# Patient Record
Sex: Male | Born: 1995 | Race: Black or African American | Hispanic: No | Marital: Single | Smoking: Never smoker
Health system: Southern US, Community
[De-identification: ages and names within clinical notes are randomized; demographics above are authoritative.]

## PROBLEM LIST (undated history)

## (undated) DIAGNOSIS — Z789 Other specified health status: Secondary | ICD-10-CM

## (undated) HISTORY — PX: NO PAST SURGERIES: SHX2092

## (undated) HISTORY — DX: Other specified health status: Z78.9

---

## 2015-05-22 LAB — GLUCOSE, POCT (MANUAL RESULT ENTRY): POC Glucose: 105 mg/dl — AB (ref 70–99)

## 2015-08-03 ENCOUNTER — Ambulatory Visit
Admission: RE | Admit: 2015-08-03 | Discharge: 2015-08-03 | Disposition: A | Discharge status: Home / Self Care | Payer: No Typology Code available for payment source | Source: Ambulatory Visit | Attending: Infectious Disease | Admitting: Infectious Disease

## 2015-08-03 ENCOUNTER — Other Ambulatory Visit: Payer: Self-pay | Admitting: Infectious Disease

## 2015-08-03 DIAGNOSIS — Z139 Encounter for screening, unspecified: Secondary | ICD-10-CM

## 2017-07-07 ENCOUNTER — Encounter (HOSPITAL_COMMUNITY): Payer: Self-pay | Admitting: *Deleted

## 2017-07-07 ENCOUNTER — Other Ambulatory Visit: Payer: Self-pay

## 2017-07-07 ENCOUNTER — Emergency Department (HOSPITAL_COMMUNITY)
Admission: EM | Admit: 2017-07-07 | Discharge: 2017-07-07 | Disposition: A | Discharge status: Home / Self Care | Payer: Self-pay | Attending: Emergency Medicine | Admitting: Emergency Medicine

## 2017-07-07 DIAGNOSIS — R369 Urethral discharge, unspecified: Secondary | ICD-10-CM | POA: Insufficient documentation

## 2017-07-07 DIAGNOSIS — A5903 Trichomonal cystitis and urethritis: Secondary | ICD-10-CM | POA: Insufficient documentation

## 2017-07-07 DIAGNOSIS — R300 Dysuria: Secondary | ICD-10-CM | POA: Insufficient documentation

## 2017-07-07 DIAGNOSIS — Z113 Encounter for screening for infections with a predominantly sexual mode of transmission: Secondary | ICD-10-CM | POA: Insufficient documentation

## 2017-07-07 LAB — URINALYSIS, ROUTINE W REFLEX MICROSCOPIC
BILIRUBIN URINE: NEGATIVE
Glucose, UA: NEGATIVE mg/dL
KETONES UR: 5 mg/dL — AB
Nitrite: NEGATIVE
Protein, ur: NEGATIVE mg/dL
SQUAMOUS EPITHELIAL / LPF: NONE SEEN
Specific Gravity, Urine: 1.019 (ref 1.005–1.030)
pH: 5 (ref 5.0–8.0)

## 2017-07-07 MED ORDER — METRONIDAZOLE 500 MG PO TABS
2000.0000 mg | ORAL_TABLET | Freq: Once | ORAL | Status: AC
  Administered 2017-07-07: 2000 mg via ORAL
  Filled 2017-07-07: qty 4

## 2017-07-07 MED ORDER — LIDOCAINE HCL (PF) 1 % IJ SOLN
INTRAMUSCULAR | Status: AC
  Administered 2017-07-07: 5 mL
  Filled 2017-07-07: qty 5

## 2017-07-07 MED ORDER — CEFTRIAXONE SODIUM 250 MG IJ SOLR
250.0000 mg | Freq: Once | INTRAMUSCULAR | Status: AC
  Administered 2017-07-07: 250 mg via INTRAMUSCULAR
  Filled 2017-07-07: qty 250

## 2017-07-07 MED ORDER — AZITHROMYCIN 250 MG PO TABS
1000.0000 mg | ORAL_TABLET | Freq: Once | ORAL | Status: AC
  Administered 2017-07-07: 1000 mg via ORAL
  Filled 2017-07-07: qty 4

## 2017-07-07 NOTE — ED Notes (Signed)
See EDP secondary assessment.  

## 2017-07-07 NOTE — ED Triage Notes (Signed)
Pt requesting std check due to burning pain with urination x 1 week.

## 2017-07-07 NOTE — Discharge Instructions (Addendum)
°  Use a condom with every sexual encounter You have been tested for chlamydia and gonorrhea. These results will be available in approximately 3 days. You will be notified if they are positive.  Follow up with your doctor or the health department if symptoms persist.  Please return to the ER for high fevers, vomiting, new or worsening symptoms, any additional concerns.

## 2017-07-07 NOTE — ED Provider Notes (Signed)
[facility] Provider Note   CSN: [id number] Arrival date & time: 07/07/17  1112     History   Chief Complaint Chief Complaint  Patient presents with  . SEXUALLY TRANSMITTED DISEASE    HPI Kycen Dombrosky is a 21 y.o. male.  The history is provided by the patient and medical records. No language interpreter was used.   Yair Gesell is an otherwise healthy 21 y.o. male who presents to the Emergency Department complaining of white penile discharge x 1 week. Associated symptoms include intermittent dysuria. He reports unprotected intercourse a few days prior to symptom onset and concerned he may have an STD. No partner with known STD. Denies fever, chills, abdominal pain, back pain, testicular pain or swelling, GU lesions. No medications taken prior to arrival for symptoms.  No alleviating or aggravating factors noted.  History reviewed. No pertinent past medical history.  There are no active problems to display for this patient.   History reviewed. No pertinent surgical history.     Home Medications    Prior to Admission medications   Not on File    Family History History reviewed. No pertinent family history.  Social History Social History   Tobacco Use  . Smoking status: Never Smoker  Substance Use Topics  . Alcohol use: No    Frequency: Never  . Drug use: No     Allergies   Patient has no known allergies.   Review of Systems Review of Systems  Constitutional: Negative for chills and fever.  Gastrointestinal: Negative for abdominal pain, nausea and vomiting.  Genitourinary: Positive for discharge and dysuria. Negative for penile pain, penile swelling, scrotal swelling and testicular pain.  Musculoskeletal: Negative for arthralgias, back pain and myalgias.  Skin: Negative for color change and wound.     Physical Exam Updated Vital Signs BP (!) 144/89 (BP Location: Right Arm)   Pulse 80   Temp 98.6 F (37  C) (Oral)   Resp 18   SpO2 99%   Physical Exam  Constitutional: He appears well-developed and well-nourished. No distress.  HENT:  Head: Normocephalic and atraumatic.  Neck: Neck supple.  Cardiovascular: Normal rate, regular rhythm and normal heart sounds.  No murmur heard. Pulmonary/Chest: Effort normal and breath sounds normal. No respiratory distress.  Abdominal:  No abdominal or CVA tenderness.  Genitourinary:  Genitourinary Comments: Chaperone present for exam. No discharge from penis. No signs of lesion or erythema on the penis or testicles. The penis and testicles are nontender. No testicular masses or swelling. No signs of any inguinal hernias. Cremaster reflex present bilaterally.  Neurological: He is alert.  Skin: Skin is warm and dry.  Nursing note and vitals reviewed.    ED Treatments / Results  Labs (all labs ordered are listed, but only abnormal results are displayed) Labs Reviewed  URINALYSIS, ROUTINE W REFLEX MICROSCOPIC - Abnormal; Notable for the following components:      Result Value   APPearance HAZY (*)    Hgb urine dipstick SMALL (*)    Ketones, ur 5 (*)    Leukocytes, UA LARGE (*)    Bacteria, UA RARE (*)    All other components within normal limits  GC/CHLAMYDIA PROBE AMP ([place]    EKG  EKG Interpretation None       Radiology No results found.  Procedures Procedures (including critical care time)  Medications Ordered in ED Medications  cefTRIAXone (ROCEPHIN) injection 250 mg (250 mg Intramuscular Given 07/07/17  1316)  azithromycin (ZITHROMAX) tablet 1,000 mg (1,000 mg Oral Given 07/07/17 1313)  lidocaine (PF) (XYLOCAINE) 1 % injection (5 mLs  Given 07/07/17 1316)  metroNIDAZOLE (FLAGYL) tablet 2,000 mg (2,000 mg Oral Given 07/07/17 1355)     Initial Impression / Assessment and Plan / ED Course  I have reviewed the triage vital signs and the nursing notes.  Pertinent labs & imaging results that were available  during my care of the patient were reviewed by me and considered in my medical decision making (see chart for details).    Yarnell Janeece FittingMabogo is a 21 y.o. male who presents to ED for penile discharge and dysuria x 1 week. Benign GU exam today. UA shows large leuks, TNTC white cells and trichomonas present. G&C obtained. Patient aware he will be notified if results are positive.  We also discussed that he should inform any partners of trichomonas diagnosis. P rophylactic azithromycin and Rocephin given.  Trichomonas treated with 2 g of Flagyl in ED today.  Recommended HIV and RPR testing, however patient declines blood work.  Health department information provided for follow-up.  Reasons to return to ED discussed and all questions answered.  Final Clinical Impressions(s) / ED Diagnoses   Final diagnoses:  Penile discharge  Trichomonal urethritis in male    ED Discharge Orders    None       Suzan Manon, Chase PicketJaime Pilcher, PA-C 07/07/17 1359    Shaune PollackIsaacs, [place], MD 07/07/17 1459

## 2017-07-08 LAB — GC/CHLAMYDIA PROBE AMP (~~LOC~~) NOT AT ARMC
Chlamydia: NEGATIVE
Neisseria Gonorrhea: POSITIVE — AB

## 2017-10-29 ENCOUNTER — Other Ambulatory Visit: Payer: Self-pay

## 2017-10-29 ENCOUNTER — Encounter (HOSPITAL_COMMUNITY): Payer: Self-pay

## 2017-10-29 ENCOUNTER — Emergency Department (HOSPITAL_COMMUNITY)
Admission: EM | Admit: 2017-10-29 | Discharge: 2017-10-29 | Disposition: A | Discharge status: Home / Self Care | Payer: Self-pay | Attending: Emergency Medicine | Admitting: Emergency Medicine

## 2017-10-29 DIAGNOSIS — Z711 Person with feared health complaint in whom no diagnosis is made: Secondary | ICD-10-CM

## 2017-10-29 DIAGNOSIS — R300 Dysuria: Secondary | ICD-10-CM | POA: Insufficient documentation

## 2017-10-29 DIAGNOSIS — Z202 Contact with and (suspected) exposure to infections with a predominantly sexual mode of transmission: Secondary | ICD-10-CM | POA: Insufficient documentation

## 2017-10-29 LAB — URINALYSIS, ROUTINE W REFLEX MICROSCOPIC
Bilirubin Urine: NEGATIVE
GLUCOSE, UA: NEGATIVE mg/dL
KETONES UR: NEGATIVE mg/dL
LEUKOCYTES UA: NEGATIVE
Nitrite: NEGATIVE
PH: 5 (ref 5.0–8.0)
Protein, ur: NEGATIVE mg/dL
SPECIFIC GRAVITY, URINE: 1.008 (ref 1.005–1.030)
Squamous Epithelial / LPF: NONE SEEN

## 2017-10-29 MED ORDER — AZITHROMYCIN 250 MG PO TABS
1000.0000 mg | ORAL_TABLET | Freq: Once | ORAL | Status: AC
  Administered 2017-10-29: 1000 mg via ORAL
  Filled 2017-10-29: qty 4

## 2017-10-29 MED ORDER — STERILE WATER FOR INJECTION IJ SOLN
INTRAMUSCULAR | Status: AC
  Administered 2017-10-29: 10 mL
  Filled 2017-10-29: qty 10

## 2017-10-29 MED ORDER — CEFTRIAXONE SODIUM 250 MG IJ SOLR
250.0000 mg | Freq: Once | INTRAMUSCULAR | Status: AC
  Administered 2017-10-29: 250 mg via INTRAMUSCULAR
  Filled 2017-10-29: qty 250

## 2017-10-29 NOTE — Discharge Instructions (Signed)
As discussed, you were treated for gonorrhea and chlamydia with a one-time dose in the emergency department today. You will receive a phone call if any of your results return positive in the next few days.  If this is the case he will need to notify any partners and have them treated as well.  Avoid sexual intercourse until 7 days after completion of treatment and resolution of symptoms.  Follow up with your primary care provider. Return sooner if symptoms worsen or new concerning symptoms in the meantime.

## 2017-10-29 NOTE — ED Triage Notes (Addendum)
Pt states it burns with urination X4 days and that he is afraid he has an STD.

## 2017-10-29 NOTE — ED Provider Notes (Signed)
[facility]   CSN: [id number] Arrival date & time: 10/29/17  [id number]     History   Chief Complaint Chief Complaint  Patient presents with  . Exposure to STD    HPI Jason Carney is a 22 y.o. male with no past medical history presenting with 4 days of dysuria reported as burning on urination only.  Denies any fever, chills, penile discharge, pain, testicular pain or swelling or any other symptoms.  He has not taken anything for his symptoms.  He explains that he had unprotected sexual intercourse and is concerned for sexually transmitted infection.  No known diagnosis from partner.  He is otherwise feeling well.  HPI  History reviewed. No pertinent past medical history.  There are no active problems to display for this patient.   History reviewed. No pertinent surgical history.     Home Medications    Prior to Admission medications   Not on File    Family History History reviewed. No pertinent family history.  Social History Social History   Tobacco Use  . Smoking status: Never Smoker  . Smokeless tobacco: Never Used  Substance Use Topics  . Alcohol use: No    Frequency: Never  . Drug use: No     Allergies   Patient has no known allergies.   Review of Systems Review of Systems  Constitutional: Negative for chills and fever.  Eyes: Negative for pain and redness.  Respiratory: Negative for cough, choking, chest tightness, shortness of breath, wheezing and stridor.   Cardiovascular: Negative for chest pain and palpitations.  Gastrointestinal: Negative for abdominal distention, abdominal pain, nausea and vomiting.  Genitourinary: Positive for dysuria. Negative for difficulty urinating, discharge, flank pain, hematuria, penile pain, penile swelling, scrotal swelling and testicular pain.  Musculoskeletal: Negative for arthralgias, back pain, gait problem, joint swelling, myalgias, neck pain and neck stiffness.   Skin: Negative for color change, pallor and rash.  Neurological: Negative for dizziness, light-headedness and headaches.     Physical Exam Updated Vital Signs BP (!) 144/100 (BP Location: Right Arm)   Pulse 72   Temp 98.1 F (36.7 C) (Oral)   SpO2 100%   Physical Exam  Constitutional: He appears well-developed and well-nourished. No distress.  Afebrile, nontoxic-appearing, sitting comfortably in chair in no acute distress.  HENT:  Head: Normocephalic and atraumatic.  Eyes: Conjunctivae and EOM are normal. Right eye exhibits no discharge. Left eye exhibits no discharge. No scleral icterus.  Neck: Normal range of motion. Neck supple.  Cardiovascular: Normal rate, regular rhythm and normal heart sounds.  Pulmonary/Chest: Effort normal and breath sounds normal. No stridor. No respiratory distress. He has no wheezes. He has no rales.  Abdominal: He exhibits no distension.  Genitourinary:  Genitourinary Comments: No penile discharge, pain, swelling, no testicular pain or swelling.  Musculoskeletal: Normal range of motion. He exhibits no edema.  Neurological: He is alert.  Skin: Skin is warm and dry. No rash noted. He is not diaphoretic. No erythema. No pallor.  Psychiatric: He has a normal mood and affect.  Nursing note and vitals reviewed.    ED Treatments / Results  Labs (all labs ordered are listed, but only abnormal results are displayed) Labs Reviewed  URINALYSIS, ROUTINE W REFLEX MICROSCOPIC - Abnormal; Notable for the following components:      Result Value   Hgb urine dipstick SMALL (*)    Bacteria, UA RARE (*)    All other components within normal limits  RPR  HIV ANTIBODY (ROUTINE TESTING)  GC/CHLAMYDIA PROBE AMP ([place]) NOT AT [facility]    EKG  EKG Interpretation None       Radiology No results found.  Procedures Procedures (including critical care time)  Medications Ordered in ED Medications  azithromycin (ZITHROMAX) tablet 1,000 mg (1,000 mg  Oral Given 10/29/17 1308)  cefTRIAXone (ROCEPHIN) injection 250 mg (250 mg Intramuscular Given 10/29/17 1309)  sterile water (preservative free) injection (10 mLs  Given 10/29/17 1309)     Initial Impression / Assessment and Plan / ED Course  I have reviewed the triage vital signs and the nursing notes.  Pertinent labs & imaging results that were available during my care of the patient were reviewed by me and considered in my medical decision making (see chart for details).    Patient presenting with 4 days of dysuria.  He is concerned for sexually transmitted infection due to recently unprotected intercourse. Denies any penile discharge, testicular swelling or pain, fever, chills or other symptoms.  Symptoms only present with urination.  Patient is afebrile without abdominal tenderness, abdominal pain or painful bowel movements to indicate prostatitis.  No tenderness to palpation of the testes or epididymis to suggest orchitis or epididymitis.  STD cultures obtained including HIV, syphilis, gonorrhea and chlamydia.   Patient to be discharged with instructions to follow up with PCP. Discussed importance of using protection when sexually active. Pt understands that they have GC/Chlamydia cultures pending and that they will need to inform all sexual partners if results return positive.   Patient has been treated prophylactically with azithromycin and Rocephin.  Discharge home with close follow-up.  Discussed return precautions, patient understood and agreed with discharge plan.  Final Clinical Impressions(s) / ED Diagnoses   Final diagnoses:  Concern about STD in male without diagnosis    ED Discharge Orders    None       Gregary Cromer 10/29/17 1323    Cathren Laine, MD 10/29/17 1354

## 2017-10-29 NOTE — ED Notes (Signed)
See EDP secondary assessment.  

## 2017-10-30 LAB — HIV ANTIBODY (ROUTINE TESTING W REFLEX): HIV Screen 4th Generation wRfx: NONREACTIVE

## 2017-10-30 LAB — RPR: RPR Ser Ql: NONREACTIVE

## 2017-10-31 LAB — GC/CHLAMYDIA PROBE AMP (~~LOC~~) NOT AT ARMC
Chlamydia: NEGATIVE
Neisseria Gonorrhea: NEGATIVE

## 2017-11-20 ENCOUNTER — Emergency Department (HOSPITAL_COMMUNITY)
Admission: EM | Admit: 2017-11-20 | Discharge: 2017-11-20 | Disposition: A | Discharge status: Home / Self Care | Payer: Self-pay | Attending: Emergency Medicine | Admitting: Emergency Medicine

## 2017-11-20 ENCOUNTER — Encounter (HOSPITAL_COMMUNITY): Payer: Self-pay | Admitting: Emergency Medicine

## 2017-11-20 DIAGNOSIS — H1033 Unspecified acute conjunctivitis, bilateral: Secondary | ICD-10-CM | POA: Insufficient documentation

## 2017-11-20 MED ORDER — FLUORESCEIN SODIUM 1 MG OP STRP
2.0000 | ORAL_STRIP | Freq: Once | OPHTHALMIC | Status: AC
  Administered 2017-11-20: 2 via OPHTHALMIC
  Filled 2017-11-20: qty 2

## 2017-11-20 MED ORDER — TETRACAINE HCL 0.5 % OP SOLN
2.0000 [drp] | Freq: Once | OPHTHALMIC | Status: AC
  Administered 2017-11-20: 2 [drp] via OPHTHALMIC
  Filled 2017-11-20: qty 4

## 2017-11-20 MED ORDER — OFLOXACIN 0.3 % OP SOLN: 1.0000 [drp] | Freq: Four times a day (QID) | OPHTHALMIC | 0 refills | Status: AC

## 2017-11-20 NOTE — Discharge Instructions (Signed)
Please read and follow all provided instructions.  Your diagnosis today includes: Conjunctivitis  Listed in your discharge instructions is an ophthalmologist (eye doctor) to schedule a follow up appointment with.  Follow-up care is necessary to be sure the infection is healing if not completely resolved in 2-3 days. See your caregiver or eye specialist as suggested for follow-up. Read the instructions below and make sure you understand reasons to return to the Emergency Department.   Tests performed today include: Visual acuity testing to check your vision Fluorescein dye examination to look for scratches on your eye Tonometry to check the pressure inside of your eye Vital signs. See below for your results today.    Conjunctivitis  Conjunctivitis is commonly called "pink eye." Conjunctivitis can be caused by bacterial or viral infection, allergies, or injuries. There is usually redness of the lining of the eye, itching, discomfort, and sometimes discharge. Pink eye is very contagious and spreads by direct contact. Please avoid spreading this to other persons. Do not rub your eye. This increases the irritation and helps spread infection. Use separate towels from other household members. Wash your hands with soap and water before and after touching your eyes. You may be given antibiotic eyedrops as part of your treatment. Before using your eye medicine, remove all drainage from the eye by washing gently with warm water and cotton balls. Continue to use the medication until you have awakened 2 mornings in a row without discharge from the eye or as directed on the drops instructions. Use cold compresses to reduce pain and sunglasses to relieve irritation from light. Do not wear contact lenses or wear eye makeup until the infection is gone.  SEEK MEDICAL CARE IF:  Your symptoms are not better after 3 days of treatment.  You have increased pain or trouble seeing.  The outer eyelids become very red or  swollen.  You develop double vision or your vision becomes blurred or worsens in any way.  You have trouble moving your eyes.  The eye looks like it is popping out (this is called proptosis).  You develop a severe headache, severe neck pain, or neck stiffness.  You develop repeated vomiting.  You have a fever (>100.57F) or persistent symptoms for more than 72 hours.  You have a fever (>100.57F) and your symptoms suddenly get worse.  Additional Information:  Your vital signs today were: BP (!) 139/95 (BP Location: Left Arm)    Pulse 86    Temp 97.9 F (36.6 C) (Oral)    Resp 16    SpO2 96%  If your blood pressure (BP) was elevated above 135/85 this visit, please have this repeated by your doctor within one month. ---------------

## 2017-11-20 NOTE — ED Provider Notes (Signed)
[facility] EMERGENCY DEPARTMENT Provider Note   CSN: [id number] Arrival date & time: 11/20/17  0535     History   Chief Complaint Chief Complaint  Patient presents with  . Conjunctivitis    HPI Jason Carney is a 22 y.o. male with no significant past medical history who presents the emergency department today for red eyes.  Patient states that 2 days ago he awoke with red, itchy, watery eyes.  He denies any associated URI symptoms.  No interventions prior to arrival.  He does not work in an area that foreign body would be high on my differential.  Nothing makes his symptoms better or worse.  No sick contacts. Denies fever, HA, N/V, loss of vision, changes in vision, flashers, floaters, blurring, diplopia, photophobia, FB sensation, discharge, trauma, rash, pain or painful EOM.  Patient does not wear contacts or glasses.  He does not have a eye doctor.  HPI  History reviewed. No pertinent past medical history.  There are no active problems to display for this patient.   History reviewed. No pertinent surgical history.      Home Medications    Prior to Admission medications   Not on File    Family History No family history on file.  Social History Social History   Tobacco Use  . Smoking status: Never Smoker  . Smokeless tobacco: Never Used  Substance Use Topics  . Alcohol use: Yes    Frequency: Never  . Drug use: No     Allergies   Patient has no known allergies.   Review of Systems Review of Systems  All other systems reviewed and are negative.    Physical Exam Updated Vital Signs BP (!) 139/95 (BP Location: Left Arm)   Pulse 86   Temp 97.9 F (36.6 C) (Oral)   Resp 16   SpO2 96%   Physical Exam  Constitutional: He appears well-developed and well-nourished. No distress.  HENT:  Head: Normocephalic and atraumatic.  Right Ear: Hearing, tympanic membrane, external ear and ear canal normal. No foreign bodies. Tympanic membrane  is not injected, not perforated, not erythematous, not retracted and not bulging.  Left Ear: Hearing, tympanic membrane, external ear and ear canal normal. No foreign bodies. Tympanic membrane is not injected, not perforated, not erythematous, not retracted and not bulging.  Nose: Nose normal. No mucosal edema, rhinorrhea, sinus tenderness, septal deviation or nasal septal hematoma.  No foreign bodies. Right sinus exhibits no maxillary sinus tenderness and no frontal sinus tenderness. Left sinus exhibits no maxillary sinus tenderness and no frontal sinus tenderness.  The patient has normal phonation and is in control of secretions. No stridor.  Midline uvula without edema. Soft palate rises symmetrically.  No tonsillar erythema or exudates. No PTA. Tongue protrusion is normal. No trismus. No creptius on neck palpation and patient has good dentition. No gingival erythema or fluctuance noted. Mucus membranes moist.  Eyes: Lids are normal.  Appearance: Both eyes with conjunctival injection and mild scleral injection that is limbal sparing. Mild watery discharge. PEERL intact. EOMI without nystagmus or pain. No photophobia or consensual photophobia.  Corneal Abrasion Exam VCO. Risks, benefits and alternatives explained. 1 drops of tetracaine (PONTOCAINE) 0.5 % ophthalmic solution were applied to the both eye. Fluorescein 1 MG ophthalmic strip applied the the surface of the both eye Wood's lamp used to screen for abrasion. No increased fluorescein uptake. No corneal ulcer. Negative Seidel sign. No foreign bodies noted. No visible hyphema.  Eye flushed with  sterile saline Patient tolerated the procedure well  Neck: Trachea normal, normal range of motion, full passive range of motion without pain and phonation normal. Neck supple. No spinous process tenderness and no muscular tenderness present. No neck rigidity. No tracheal deviation and normal range of motion present.  No nuchal rigidity or meningismus    Pulmonary/Chest: Effort normal.  Abdominal: Soft.  Lymphadenopathy:       Head (right side): No submandibular and no preauricular adenopathy present.       Head (left side): No preauricular adenopathy present.    He has no cervical adenopathy.  Neurological: He is alert.  Skin: Skin is warm and dry. No rash noted.  No vesicular-like rash.  Psychiatric: He has a normal mood and affect.  Nursing note and vitals reviewed.    ED Treatments / Results  Labs (all labs ordered are listed, but only abnormal results are displayed) Labs Reviewed - No data to display  EKG None  Radiology No results found.  Procedures Procedures (including critical care time)  Medications Ordered in ED Medications  tetracaine (PONTOCAINE) 0.5 % ophthalmic solution 2 drop (has no administration in time range)  fluorescein ophthalmic strip 2 strip (has no administration in time range)     Initial Impression / Assessment and Plan / ED Course  I have reviewed the triage vital signs and the nursing notes.  Pertinent labs & imaging results that were available during my care of the patient were reviewed by me and considered in my medical decision making (see chart for details).     Conjunctivitis  Patient presentation consistent with conjunctivitis.  Given bilateral nature will cover for bacterial.  No purulent discharge, corneal abrasions, entrapment, consensual photophobia, or dendritic staining with fluorescein study.  Presentation non-concerning for iritis, corneal abrasions, or HSV.  Will give ofloxacin drops.  Personal hygiene and frequent handwashing discussed.  Patient advised to followup with ophthalmologist if symptoms persist or worsen in any way including vision change or entrapment. Strict return precautions discussed. Patient verbalizes understanding and is agreeable with discharge.  Final Clinical Impressions(s) / ED Diagnoses   Final diagnoses:  Acute bacterial conjunctivitis of both  eyes    ED Discharge Orders        Ordered    ofloxacin (OCUFLOX) 0.3 % ophthalmic solution  4 times daily     11/20/17 0951       Jacinto HalimMaczis, Zackary Mckeone M, PA-C 11/20/17 [id number]    Arby BarrettePfeiffer, Marcy, MD 11/29/17 1556

## 2017-11-20 NOTE — ED Triage Notes (Signed)
Patient reports bilateral eye redness with itching and mild lacrimation for 2 days , denies injury /no loss of vision .

## 2017-12-02 ENCOUNTER — Emergency Department (HOSPITAL_COMMUNITY)
Admission: EM | Admit: 2017-12-02 | Discharge: 2017-12-02 | Disposition: A | Discharge status: Home / Self Care | Payer: Self-pay | Attending: Emergency Medicine | Admitting: Emergency Medicine

## 2017-12-02 DIAGNOSIS — Z008 Encounter for other general examination: Secondary | ICD-10-CM | POA: Insufficient documentation

## 2017-12-02 DIAGNOSIS — Z0000 Encounter for general adult medical examination without abnormal findings: Secondary | ICD-10-CM

## 2017-12-02 NOTE — Discharge Instructions (Addendum)
You may return to work.

## 2017-12-02 NOTE — ED Provider Notes (Signed)
[facility] EMERGENCY DEPARTMENT Provider Note   CSN: [id number] Arrival date & time: 12/02/17  1621     History   Chief Complaint Chief Complaint  Patient presents with  . Follow-up    HPI Jason Carney is a 22 y.o. male.  HPI Jason Carney is a 22 y.o. male presents to emergency department with complaint of needing a work note.  Patient states he has been sick over the last week.  States he has had eye infection 2 weeks ago, for which he was seen here.  Patient never got his prescription for eyedrops filled and never followed up with ophthalmology as he was told.  He states over the last week he has had nausea and vomiting, states "just felt bad."  He states he stayed in bed all week.  He states he is feeling much better and ready to go to work, however his job told her he needed a note.  He has no complaints at this time.  No past medical history on file.  There are no active problems to display for this patient.   No past surgical history on file.      Home Medications    Prior to Admission medications   Not on File    Family History No family history on file.  Social History Social History   Tobacco Use  . Smoking status: Never Smoker  . Smokeless tobacco: Never Used  Substance Use Topics  . Alcohol use: Yes    Frequency: Never  . Drug use: No     Allergies   Patient has no known allergies.   Review of Systems Review of Systems  Constitutional: Negative for chills and fever.  Respiratory: Negative for cough, chest tightness and shortness of breath.   Cardiovascular: Negative for chest pain, palpitations and leg swelling.  Gastrointestinal: Negative for abdominal distention, abdominal pain, diarrhea, nausea and vomiting.  Genitourinary: Negative for dysuria, frequency, hematuria and urgency.  Musculoskeletal: Negative for arthralgias, myalgias, neck pain and neck stiffness.  Skin: Negative for rash.  Allergic/Immunologic:  Negative for immunocompromised state.  Neurological: Negative for dizziness, weakness, light-headedness, numbness and headaches.     Physical Exam Updated Vital Signs BP (!) 153/93 (BP Location: Right Arm)   Pulse 82   Temp 98 F (36.7 C) (Oral)   Resp 18   SpO2 98%   Physical Exam  Constitutional: He appears well-developed and well-nourished. No distress.  HENT:  Head: Normocephalic and atraumatic.  Eyes: Conjunctivae are normal.  Neck: Neck supple.  Cardiovascular: Normal rate, regular rhythm and normal heart sounds.  Pulmonary/Chest: Effort normal. No respiratory distress. He has no wheezes. He has no rales.  Abdominal: Soft. Bowel sounds are normal. He exhibits no distension. There is no tenderness. There is no rebound.  Musculoskeletal: He exhibits no edema.  Neurological: He is alert.  Skin: Skin is warm and dry.  Nursing note and vitals reviewed.    ED Treatments / Results  Labs (all labs ordered are listed, but only abnormal results are displayed) Labs Reviewed - No data to display  EKG None  Radiology No results found.  Procedures Procedures (including critical care time)  Medications Ordered in ED Medications - No data to display   Initial Impression / Assessment and Plan / ED Course  I have reviewed the triage vital signs and the nursing notes.  Pertinent labs & imaging results that were available during my care of the patient were reviewed by me and considered in  my medical decision making (see chart for details).     Patient requesting a work note that states that he has been sick over the last week.  I explained to him that he was seen in ER 2 weeks ago, and I am not sure if he was sick for the last week or not and I am unable to verify it, therefore I cannot give him a work note that states he has been sick.  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  He has no complaints otherwise, vital signs are normal other  than mild hypertension, he has no symptoms, negative review of symptoms, he is stable for discharge home.  Vitals:   12/02/17 1718  BP: (!) 153/93  Pulse: 82  Resp: 18  Temp: 98 F (36.7 C)  TempSrc: Oral  SpO2: 98%     Final Clinical Impressions(s) / ED Diagnoses   Final diagnoses:  Normal exam    ED Discharge Orders    None       Jaynie CrumbleKirichenko, Elfida Shimada, PA-C 12/02/17 1728    Melene PlanFloyd, Dan, DO 12/02/17 2258

## 2017-12-02 NOTE — ED Triage Notes (Signed)
States here to ER for work note stating he may return to work.

## 2018-08-30 DIAGNOSIS — G4489 Other headache syndrome: Secondary | ICD-10-CM | POA: Insufficient documentation

## 2018-08-30 DIAGNOSIS — R109 Unspecified abdominal pain: Secondary | ICD-10-CM | POA: Insufficient documentation

## 2018-08-30 DIAGNOSIS — R5383 Other fatigue: Secondary | ICD-10-CM | POA: Insufficient documentation

## 2018-08-30 DIAGNOSIS — R208 Other disturbances of skin sensation: Secondary | ICD-10-CM | POA: Insufficient documentation

## 2018-08-30 DIAGNOSIS — R197 Diarrhea, unspecified: Secondary | ICD-10-CM | POA: Insufficient documentation

## 2018-08-30 DIAGNOSIS — R509 Fever, unspecified: Secondary | ICD-10-CM | POA: Insufficient documentation

## 2018-08-30 DIAGNOSIS — R030 Elevated blood-pressure reading, without diagnosis of hypertension: Secondary | ICD-10-CM | POA: Insufficient documentation

## 2018-08-30 DIAGNOSIS — R251 Tremor, unspecified: Secondary | ICD-10-CM | POA: Insufficient documentation

## 2018-08-30 DIAGNOSIS — J029 Acute pharyngitis, unspecified: Secondary | ICD-10-CM | POA: Insufficient documentation

## 2018-08-31 ENCOUNTER — Encounter (HOSPITAL_COMMUNITY): Payer: Self-pay

## 2018-08-31 ENCOUNTER — Emergency Department (HOSPITAL_COMMUNITY)
Admission: EM | Admit: 2018-08-31 | Discharge: 2018-08-31 | Disposition: A | Discharge status: Home / Self Care | Payer: Self-pay | Attending: Emergency Medicine | Admitting: Emergency Medicine

## 2018-08-31 DIAGNOSIS — G4489 Other headache syndrome: Secondary | ICD-10-CM

## 2018-08-31 DIAGNOSIS — R197 Diarrhea, unspecified: Secondary | ICD-10-CM

## 2018-08-31 LAB — CBC WITH DIFFERENTIAL/PLATELET
ABS IMMATURE GRANULOCYTES: 0.02 10*3/uL (ref 0.00–0.07)
BASOS PCT: 0 %
Basophils Absolute: 0 10*3/uL (ref 0.0–0.1)
Eosinophils Absolute: 0 10*3/uL (ref 0.0–0.5)
Eosinophils Relative: 0 %
HCT: 46.9 % (ref 39.0–52.0)
Hemoglobin: 16.3 g/dL (ref 13.0–17.0)
IMMATURE GRANULOCYTES: 0 %
Lymphocytes Relative: 11 %
Lymphs Abs: 0.8 10*3/uL (ref 0.7–4.0)
MCH: 30.4 pg (ref 26.0–34.0)
MCHC: 34.8 g/dL (ref 30.0–36.0)
MCV: 87.3 fL (ref 80.0–100.0)
Monocytes Absolute: 0.8 10*3/uL (ref 0.1–1.0)
Monocytes Relative: 12 %
NEUTROS ABS: 5.5 10*3/uL (ref 1.7–7.7)
NRBC: 0 % (ref 0.0–0.2)
Neutrophils Relative %: 77 %
PLATELETS: 117 10*3/uL — AB (ref 150–400)
RBC: 5.37 MIL/uL (ref 4.22–5.81)
RDW: 11.4 % — ABNORMAL LOW (ref 11.5–15.5)
WBC: 7.1 10*3/uL (ref 4.0–10.5)

## 2018-08-31 LAB — COMPREHENSIVE METABOLIC PANEL
ALT: 52 U/L — AB (ref 0–44)
AST: 139 U/L — AB (ref 15–41)
Albumin: 4.4 g/dL (ref 3.5–5.0)
Alkaline Phosphatase: 68 U/L (ref 38–126)
Anion gap: 16 — ABNORMAL HIGH (ref 5–15)
BUN: 10 mg/dL (ref 6–20)
CHLORIDE: 103 mmol/L (ref 98–111)
CO2: 19 mmol/L — AB (ref 22–32)
CREATININE: 0.91 mg/dL (ref 0.61–1.24)
Calcium: 9.7 mg/dL (ref 8.9–10.3)
GFR calc Af Amer: >60 mL/min (ref >60)
Glucose, Bld: 100 mg/dL — ABNORMAL HIGH (ref 70–99)
Potassium: 3.9 mmol/L (ref 3.5–5.1)
SODIUM: 138 mmol/L (ref 135–145)
Total Bilirubin: 3.1 mg/dL — ABNORMAL HIGH (ref 0.3–1.2)
Total Protein: 8.8 g/dL — ABNORMAL HIGH (ref 6.5–8.1)

## 2018-08-31 MED ORDER — IBUPROFEN 200 MG PO TABS
400.0000 mg | ORAL_TABLET | Freq: Once | ORAL | Status: AC
  Administered 2018-08-31: 400 mg via ORAL
  Filled 2018-08-31: qty 2

## 2018-08-31 NOTE — ED Provider Notes (Signed)
[facility]-EMERGENCY DEPT Provider Note   CSN: [id number] Arrival date & time: 08/30/18  2357     History   Chief Complaint Chief Complaint  Patient presents with  . flu like sx  . Headache    HPI Jason Carney is a 23 y.o. male.  The history is provided by the patient. A language interpreter was used (swahili via language line).  Headache  Pain location:  Frontal Quality:  Dull Onset quality:  Gradual Duration:  3 days Chronicity:  New Worsened by:  Nothing Associated symptoms: abdominal pain, diarrhea, fatigue, fever, nausea, sore throat and vomiting   Associated symptoms: no cough   Associated symptoms comment:  "skin burning" Patient reports past 3 days has had headache, mild abdominal pain and diarrhea.  He reports feeling feverish with nausea.  He is also having sore throat   PMH-none No medications Soc hx - no travel Reports he quit ETOH Home Medications    Prior to Admission medications   Not on File    Family History History reviewed. No pertinent family history.  Social History Social History   Tobacco Use  . Smoking status: Never Smoker  . Smokeless tobacco: Never Used  Substance Use Topics  . Alcohol use: Yes    Frequency: Never  . Drug use: No     Allergies   Patient has no known allergies.   Review of Systems Review of Systems  Constitutional: Positive for fatigue and fever.  HENT: Positive for sore throat. Negative for drooling.   Respiratory: Negative for cough.   Gastrointestinal: Positive for abdominal pain, diarrhea, nausea and vomiting.  Neurological: Positive for headaches.  All other systems reviewed and are negative.    Physical Exam Updated Vital Signs BP (!) 171/98 (BP Location: Right Arm)   Pulse 95   Temp 99.1 F (37.3 C) (Oral)   Resp 18   Ht 1.702 m (5\' 7" )   Wt 76.6 kg   SpO2 100%   BMI 26.44 kg/m   Physical Exam  CONSTITUTIONAL: Well developed/well nourished HEAD:  Normocephalic/atraumatic EYES: EOMI/PERRL, mild icterus ENMT: Mucous membranes moist, uvula midline, no stridor, no drooling NECK: supple no meningeal signs SPINE/BACK:entire spine nontender CV: S1/S2 noted, no murmurs/rubs/gallops noted LUNGS: Lungs are clear to auscultation bilaterally, no apparent distress ABDOMEN: soft, nontender, no rebound or guarding, bowel sounds noted throughout abdomen GU:no cva tenderness NEURO: Pt is awake/alert/appropriate, moves all extremitiesx4.  No facial droop.  No arm drift.  Tremor noted No ataxia EXTREMITIES: pulses normal/equal, full ROM SKIN: warm, color normal PSYCH: no abnormalities of mood noted, alert and oriented to situation  ED Treatments / Results  Labs (all labs ordered are listed, but only abnormal results are displayed) Labs Reviewed  CBC WITH DIFFERENTIAL/PLATELET - Abnormal; Notable for the following components:      Result Value   RDW 11.4 (*)    Platelets 117 (*)    All other components within normal limits  COMPREHENSIVE METABOLIC PANEL - Abnormal; Notable for the following components:   CO2 19 (*)    Glucose, Bld 100 (*)    Total Protein 8.8 (*)    AST 139 (*)    ALT 52 (*)    Total Bilirubin 3.1 (*)    Anion gap 16 (*)    All other components within normal limits    EKG None  Radiology No results found.  Procedures Procedures    Medications Ordered in ED Medications - No data to display  Initial Impression / Assessment and Plan / ED Course  I have reviewed the triage vital signs and the nursing notes.  Pertinent labs   results that were available during my care of the patient were reviewed by me and considered in my medical decision making (see chart for details).     Patient presents for what sounds to be a viral illness with headache, diarrhea and sore throat.  I utilized Tax adviserwahili interpreter for initial evaluation.  Patient also speaks [place]. He initially denied drinking alcohol, but then reports he  had a drink several days ago.  He had tremors throughout the exam as well as hypertension, my suspicion is that he drinks alcohol more than he admits.  His labs are also consistent with  potential alcohol abuse.  He is mildly dehydrated.    Overall patient is improved.  He has been resting comfortably.  He was afebrile on my check.  He feels safe for discharge home.  He will be referred to outpatient PCP Final Clinical Impressions(s) / ED Diagnoses   Final diagnoses:  Other headache syndrome  Diarrhea of presumed infectious origin    ED Discharge Orders    None       Zadie RhineWickline, Trystian Crisanto, MD 08/31/18 [phone]

## 2018-08-31 NOTE — Discharge Instructions (Addendum)
You can use benadryl for itching You can use ibuprofen for headache Please avoid alcohol

## 2018-08-31 NOTE — ED Triage Notes (Signed)
Pt complains of a headache, body aches and a sore throat, he states it feels like something is stuck in his throat

## 2018-12-02 ENCOUNTER — Other Ambulatory Visit: Payer: Self-pay

## 2018-12-02 ENCOUNTER — Emergency Department (HOSPITAL_COMMUNITY)
Admission: EM | Admit: 2018-12-02 | Discharge: 2018-12-02 | Disposition: A | Discharge status: Home / Self Care | Payer: Self-pay | Attending: Emergency Medicine | Admitting: Emergency Medicine

## 2018-12-02 DIAGNOSIS — R51 Headache: Secondary | ICD-10-CM | POA: Insufficient documentation

## 2018-12-02 DIAGNOSIS — R1010 Upper abdominal pain, unspecified: Secondary | ICD-10-CM | POA: Insufficient documentation

## 2018-12-02 NOTE — ED Provider Notes (Signed)
[facility] EMERGENCY DEPARTMENT Provider Note   CSN: [id number] Arrival date & time: 12/02/18  5993    History   Chief Complaint Chief Complaint  Patient presents with  . Abdominal Pain  . Headache  . Pruritis    HPI Jason Carney is a 23 y.o. male.     HPI   23 year old male presents today with several complaints.  Patient notes 2 days ago he developed generalized abdominal pain worse in the upper abdomen.  Patient notes yesterday he developed a generalized headache with no focal neurological deficits.  He denies any fever, neck stiffness, nausea vomiting or diarrhea.  He denies any new exposures close sick contacts or any other infectious etiology.  Patient notes he has been eating and drinking appropriately with no pain with eating.  He notes he had a headache at work yesterday and was sent home and sent to the emergency room for work note to return.   No past medical history on file.  There are no active problems to display for this patient.   No past surgical history on file.    Home Medications    Prior to Admission medications   Not on File    Family History No family history on file.  Social History Social History   Tobacco Use  . Smoking status: Never Smoker  . Smokeless tobacco: Never Used  Substance Use Topics  . Alcohol use: Yes    Frequency: Never  . Drug use: No     Allergies   Patient has no known allergies.   Review of Systems Review of Systems  All other systems reviewed and are negative.  Physical Exam Updated Vital Signs BP (!) 147/108 (BP Location: Right Arm)   Pulse 89   Temp 98 F (36.7 C) (Oral)   Resp 16   SpO2 98%   Physical Exam Vitals signs and nursing note reviewed.  Constitutional:      Appearance: He is well-developed.  HENT:     Head: Normocephalic and atraumatic.     Comments: Oropharynx clear no erythema edema or exudate-supple full active range of motion Eyes:     General: No scleral  icterus.       Right eye: No discharge.        Left eye: No discharge.     Conjunctiva/sclera: Conjunctivae normal.     Pupils: Pupils are equal, round, and reactive to light.  Neck:     Musculoskeletal: Normal range of motion.     Vascular: No JVD.     Trachea: No tracheal deviation.  Pulmonary:     Effort: Pulmonary effort is normal.     Breath sounds: No stridor.  Abdominal:     Comments: Soft nondistended abdomen with very minimal tenderness to the bilateral upper abdomen lower abdomen soft nontender  Skin:    Comments: No rash noted  Neurological:     Mental Status: He is alert and oriented to person, place, and time.     Coordination: Coordination normal.  Psychiatric:        Behavior: Behavior normal.        Thought Content: Thought content normal.        Judgment: Judgment normal.      ED Treatments / Results  Labs (all labs ordered are listed, but only abnormal results are displayed) Labs Reviewed - No data to display  EKG None  Radiology No results found.  Procedures Procedures (including critical care time)  Medications Ordered  in ED Medications - No data to display   Initial Impression / Assessment and Plan / ED Course  I have reviewed the triage vital signs and the nursing notes.  Pertinent labs & imaging results that were available during my care of the patient were reviewed by me and considered in my medical decision making (see chart for details).          Assessment/Plan: 23 year old male presents today with complaints.  He is very well-appearing in no acute distress.  Is afebrile eating and drinking appropriately with no pain.  He has a soft minimally tender abdomen.  I have very low suspicion for acute life-threatening intra-abdominal pathology.  Patient will be treated symptomatically encouraged to drink plenty of fluids, healthy diet, return immediately if develops any new or worsening signs or symptoms.  He reports he is here to obtain a  work note so that he may return to work.    Final Clinical Impressions(s) / ED Diagnoses   Final diagnoses:  Upper abdominal pain    ED Discharge Orders    None       Rosalio LoudHedges, Braylea Brancato, PA-C 12/02/18 1003    Terrilee FilesButler, Michael C, MD 12/02/18 [phone]

## 2018-12-02 NOTE — ED Triage Notes (Signed)
C/o itching since Tuesday, abdominal pain bilateral UQ abdominal pain and headache. No new foods recently or exposure to environmental allergens or new soaps

## 2018-12-02 NOTE — Discharge Instructions (Addendum)
Please read attached information. If you experience any new or worsening signs or symptoms please return to the emergency room for evaluation. Please follow-up with your primary care provider or specialist as discussed.  °

## 2018-12-09 ENCOUNTER — Ambulatory Visit: Payer: Self-pay | Attending: Family Medicine | Admitting: Physician Assistant

## 2018-12-09 ENCOUNTER — Other Ambulatory Visit: Payer: Self-pay

## 2018-12-09 NOTE — Progress Notes (Signed)
Patient ID: Jason Carney, male   DOB: [birth year], 24 y.o.   MRN: [id number]  After going to the ED 12/02/2018 to get a RTW note with several resolving complaints.  No labs or imaging was obtained.   From ED note:  23 year old male presents today with complaints.  He is very well-appearing in no acute distress.  Is afebrile eating and drinking appropriately with no pain.  He has a soft minimally tender abdomen.  I have very low suspicion for acute life-threatening intra-abdominal pathology.  Patient will be treated symptomatically encouraged to drink plenty of fluids, healthy diet, return immediately if develops any new or worsening signs or symptoms.  He reports he is here to obtain a work note so that he may return to work.

## 2018-12-09 NOTE — Progress Notes (Signed)
Called patient using [employer] to initiate his televisit with provider Georgian Co, PA.The "home" number keeps ringing to a voicemail that isn't set up. The "mobile" number is answered but it's somebody who speaks spanish.

## 2018-12-20 ENCOUNTER — Encounter: Payer: Self-pay | Admitting: Family Medicine

## 2018-12-20 ENCOUNTER — Other Ambulatory Visit: Payer: Self-pay

## 2018-12-20 ENCOUNTER — Ambulatory Visit: Payer: Self-pay | Attending: Family Medicine | Admitting: Family Medicine

## 2018-12-20 VITALS — BP 145/107 | HR 86 | Temp 98.5°F | Ht 67.0 in | Wt 168.2 lb

## 2018-12-20 DIAGNOSIS — D696 Thrombocytopenia, unspecified: Secondary | ICD-10-CM | POA: Insufficient documentation

## 2018-12-20 DIAGNOSIS — R748 Abnormal levels of other serum enzymes: Secondary | ICD-10-CM | POA: Insufficient documentation

## 2018-12-20 DIAGNOSIS — R030 Elevated blood-pressure reading, without diagnosis of hypertension: Secondary | ICD-10-CM | POA: Insufficient documentation

## 2018-12-20 NOTE — Progress Notes (Signed)
New Patient Office Visit  Subjective:  Patient ID: Jason Carney, male    DOB: 1996-06-11  Age: 23 y.o. MRN: [id number]  Due to a language barrier an audio interpreter was used at today's visit (Swahili)  CC:  Emergency Department follow-up of abdominal pain   HPI Jason Carney presents for follow-up of emergency department visit on 12/02/2018.  Patient states that about 2 weeks ago he was at work and started to have a headache as well as abdominal pain.  When he reported this to his supervisor, he was told that he needed to see a doctor and get a note for return to work.  Patient states that he was seen at the emergency department and they told him that he needed to be seen here at this address to obtain a note for return to work.  Patient states that he now feels fine and feels that he can return to work.  He denies any current headaches, no abdominal pain.  Past Medical History:  Diagnosis Date  . Known health problems: none    Past Surgical History:  Procedure Laterality Date  . NO PAST SURGERIES     Family History  Problem Relation Age of Onset  . Hypertension Neg Hx   . Diabetes Neg Hx   . Cancer Neg Hx   . Heart disease Neg Hx      Social History   Tobacco Use  . Smoking status: Never Smoker  . Smokeless tobacco: Never Used  Substance Use Topics  . Alcohol use: Yes    Frequency: Never  . Drug use: No   No Known Allergies ROS Review of Systems  Constitutional: Negative for appetite change, chills, diaphoresis, fatigue and fever.  HENT: Negative for congestion, ear pain, sore throat and trouble swallowing.   Eyes: Negative for photophobia and visual disturbance.  Respiratory: Negative for cough and shortness of breath.   Cardiovascular: Negative for chest pain, palpitations and leg swelling.  Gastrointestinal: Negative for abdominal pain, blood in stool, constipation, diarrhea, nausea and vomiting.  Endocrine: Negative for cold intolerance, heat intolerance,  polydipsia, polyphagia and polyuria.  Genitourinary: Negative for dysuria, flank pain and frequency.  Musculoskeletal: Negative for arthralgias, back pain and gait problem.  Neurological: Negative for dizziness and headaches.  Hematological: Negative for adenopathy. Does not bruise/bleed easily.  Psychiatric/Behavioral: Negative for sleep disturbance and suicidal ideas. The patient is not nervous/anxious.     Objective:   Today's Vitals: BP (!) 146/102 (BP Location: Right Arm, Patient Position: Sitting, Cuff Size: Normal)   Pulse 80   Temp 98.5 F (36.9 C) (Oral)   Ht 5\' 7"  (1.702 m)   Wt 168 lb 3.2 oz (76.3 kg)   SpO2 97%   BMI 26.34 kg/m   Physical Exam Vitals signs and nursing note reviewed.  Constitutional:      General: He is not in acute distress.    Appearance: Normal appearance.  HENT:     Head: Normocephalic and atraumatic.     Right Ear: Tympanic membrane, ear canal and external ear normal.     Left Ear: Tympanic membrane, ear canal and external ear normal.     Nose: Nose normal. No congestion or rhinorrhea.     Mouth/Throat:     Mouth: Mucous membranes are moist.     Pharynx: Oropharynx is clear. No oropharyngeal exudate or posterior oropharyngeal erythema.  Eyes:     Extraocular Movements: Extraocular movements intact.     Conjunctiva/sclera: Conjunctivae normal.  Neck:  Musculoskeletal: Normal range of motion and neck supple. No muscular tenderness.  Cardiovascular:     Rate and Rhythm: Normal rate and regular rhythm.  Pulmonary:     Effort: Pulmonary effort is normal.     Breath sounds: Normal breath sounds.  Abdominal:     Palpations: Abdomen is soft. There is no mass.     Tenderness: There is no abdominal tenderness. There is no right CVA tenderness, left CVA tenderness, guarding or rebound.  Musculoskeletal: Normal range of motion.        General: No swelling or tenderness.  Lymphadenopathy:     Cervical: No cervical adenopathy.  Skin:     General: Skin is warm and dry.  Neurological:     General: No focal deficit present.     Mental Status: He is alert and oriented to person, place, and time.     Cranial Nerves: No cranial nerve deficit.  Psychiatric:        Mood and Affect: Mood normal.        Behavior: Behavior normal.        Thought Content: Thought content normal.        Judgment: Judgment normal.     Assessment & Plan:  1. Abnormal liver enzymes On review of chart, patient with abnormal liver enzymes in Jan of 2020 including total bilirubin of 3.1, AST of 139 and ALT of 52. Patient reports no current abdominal pain and no GI symptoms at this time. No history of hepatitis or gallstones of which he is aware. Will recheck CMP - Comprehensive metabolic panel  2. Thrombocytopenia (HCC) Patient with low platelet count of 117 done  08/31/2018. Will repeat CBC at today's visit - CBC with Differential  3. Elevated blood pressure reading Patient with elevated blood pressure. Information given on Hypertension and DASH diet discussed and patient is asked to return for blood pressure follow-up in a few weeks. - Comprehensive metabolic panel  Note provided that patient can return to work as he denies any current headache or abdominal pain but he is advised to monitor his blood pressure and follow a low sodium diet  An After Visit Summary was printed and given to the patient.  Follow-up: Return in about 3 months (around 03/21/2019) for HTN/liver enzymes-2 week nurse BP check;.  Cain Saupeammie Oleta Gunnoe, MD

## 2018-12-20 NOTE — Patient Instructions (Addendum)
Your blood pressure was elevated at your visit today. Please try to reduce the amount of salt in your diet. Your liver enzymes are being checked today because they were abnormal on blood work that you had in January. If they are still abnormal you will be notified about additional tests that may be needed. Hypertension Hypertension is another name for high blood pressure. High blood pressure forces your heart to work harder to pump blood. This can cause problems over time. There are two numbers in a blood pressure reading. There is a top number (systolic) over a bottom number (diastolic). It is best to have a blood pressure below 120/80. Healthy choices can help lower your blood pressure. You may need medicine to help lower your blood pressure if:  Your blood pressure cannot be lowered with healthy choices.  Your blood pressure is higher than 130/80. Follow these instructions at home: Eating and drinking   If directed, follow the DASH eating plan. This diet includes: ? Filling half of your plate at each meal with fruits and vegetables. ? Filling one quarter of your plate at each meal with whole grains. Whole grains include whole wheat pasta, brown rice, and whole grain bread. ? Eating or drinking low-fat dairy products, such as skim milk or low-fat yogurt. ? Filling one quarter of your plate at each meal with low-fat (lean) proteins. Low-fat proteins include fish, skinless chicken, eggs, beans, and tofu. ? Avoiding fatty meat, cured and processed meat, or chicken with skin. ? Avoiding premade or processed food.  Eat less than 1,500 mg of salt (sodium) a day.  Limit alcohol use to no more than 1 drink a day for nonpregnant women and 2 drinks a day for men. One drink equals 12 oz of beer, 5 oz of wine, or 1 oz of hard liquor. Lifestyle  Work with your doctor to stay at a healthy weight or to lose weight. Ask your doctor what the best weight is for you.  Get at least 30 minutes of exercise  that causes your heart to beat faster (aerobic exercise) most days of the week. This may include walking, swimming, or biking.  Get at least 30 minutes of exercise that strengthens your muscles (resistance exercise) at least 3 days a week. This may include lifting weights or pilates.  Do not use any products that contain nicotine or tobacco. This includes cigarettes and e-cigarettes. If you need help quitting, ask your doctor.  Check your blood pressure at home as told by your doctor.  Keep all follow-up visits as told by your doctor. This is important. Medicines  Take over-the-counter and prescription medicines only as told by your doctor. Follow directions carefully.  Do not skip doses of blood pressure medicine. The medicine does not work as well if you skip doses. Skipping doses also puts you at risk for problems.  Ask your doctor about side effects or reactions to medicines that you should watch for. Contact a doctor if:  You think you are having a reaction to the medicine you are taking.  You have headaches that keep coming back (recurring).  You feel dizzy.  You have swelling in your ankles.  You have trouble with your vision. Get help right away if:  You get a very bad headache.  You start to feel confused.  You feel weak or numb.  You feel faint.  You get very bad pain in your: ? Chest. ? Belly (abdomen).  You throw up (vomit) more than once.  You have trouble breathing. Summary  Hypertension is another name for high blood pressure.  Making healthy choices can help lower blood pressure. If your blood pressure cannot be controlled with healthy choices, you may need to take medicine. This information is not intended to replace advice given to you by your health care provider. Make sure you discuss any questions you have with your health care provider. Document Released: 01/28/2008 Document Revised: 07/09/2016 Document Reviewed: 07/09/2016 Elsevier Interactive  Patient Education  2019 [employer]

## 2018-12-20 NOTE — Progress Notes (Signed)
Headache and stomach pain when he was at work.  Upper abd pain sometimes,    He got sick at work and now his work wants to make sure he's good before he can return to work and they need a note from the doctor clearing him to return to work

## 2018-12-21 ENCOUNTER — Encounter: Payer: Self-pay | Admitting: Family Medicine

## 2018-12-21 LAB — CBC WITH DIFFERENTIAL/PLATELET
Basophils Absolute: 0 x10E3/uL (ref 0.0–0.2)
Basos: 1 %
EOS (ABSOLUTE): 0.2 x10E3/uL (ref 0.0–0.4)
Eos: 5 %
Hematocrit: 48.1 % (ref 37.5–51.0)
Hemoglobin: 17.8 g/dL — ABNORMAL HIGH (ref 13.0–17.7)
Immature Grans (Abs): 0.1 x10E3/uL (ref 0.0–0.1)
Immature Granulocytes: 2 %
Lymphocytes Absolute: 1.4 x10E3/uL (ref 0.7–3.1)
Lymphs: 34 %
MCH: 30.8 pg (ref 26.6–33.0)
MCHC: 37 g/dL — ABNORMAL HIGH (ref 31.5–35.7)
MCV: 83 fL (ref 79–97)
Monocytes Absolute: 1 x10E3/uL — ABNORMAL HIGH (ref 0.1–0.9)
Monocytes: 25 %
Neutrophils Absolute: 1.3 x10E3/uL — ABNORMAL LOW (ref 1.4–7.0)
Neutrophils: 33 %
Platelets: 212 x10E3/uL (ref 150–450)
RBC: 5.78 x10E6/uL (ref 4.14–5.80)
RDW: 15.1 % (ref 11.6–15.4)
WBC: 4.1 x10E3/uL (ref 3.4–10.8)

## 2018-12-21 LAB — COMPREHENSIVE METABOLIC PANEL WITH GFR
ALT: 51 IU/L — ABNORMAL HIGH (ref 0–44)
AST: 75 IU/L — ABNORMAL HIGH (ref 0–40)
Albumin/Globulin Ratio: 1.1 — ABNORMAL LOW (ref 1.2–2.2)
Albumin: 4 g/dL — ABNORMAL LOW (ref 4.1–5.2)
Alkaline Phosphatase: 129 IU/L — ABNORMAL HIGH (ref 39–117)
BUN/Creatinine Ratio: 5 — ABNORMAL LOW (ref 9–20)
BUN: 4 mg/dL — ABNORMAL LOW (ref 6–20)
Bilirubin Total: 0.7 mg/dL (ref 0.0–1.2)
CO2: 20 mmol/L (ref 20–29)
Calcium: 9.4 mg/dL (ref 8.7–10.2)
Chloride: 98 mmol/L (ref 96–106)
Creatinine, Ser: 0.8 mg/dL (ref 0.76–1.27)
GFR calc Af Amer: 145 mL/min/1.73
GFR calc non Af Amer: 125 mL/min/1.73
Globulin, Total: 3.7 g/dL (ref 1.5–4.5)
Glucose: 93 mg/dL (ref 65–99)
Potassium: 5 mmol/L (ref 3.5–5.2)
Sodium: 138 mmol/L (ref 134–144)
Total Protein: 7.7 g/dL (ref 6.0–8.5)

## 2018-12-28 ENCOUNTER — Encounter: Payer: Self-pay | Admitting: *Deleted

## 2019-01-03 ENCOUNTER — Encounter: Payer: Self-pay | Admitting: Pharmacist

## 2019-01-04 NOTE — Progress Notes (Deleted)
   S:    PCP: Dr. Jillyn Hidden  Patient arrives ***.    Presents to the clinic for hypertension evaluation, counseling, and management. Patient was referred by Dr. Jillyn Hidden on 12/20/18. BP was elevated but patient has not been diagnosed with HTN.   Patient does not currently take medications.  Dietary habits include: *** Exercise habits include:*** Family / Social history: ***  ASCVD risk factors include:***  Home BP readings: ***  O:  L arm after 5 minutes rest: ***, HR *** Last 3 Office BP readings: BP Readings from Last 3 Encounters:  12/20/18 (!) 145/107  12/02/18 (!) 147/108  08/31/18 (!) 157/102   BMET    Component Value Date/Time   NA 138 12/20/2018 1432   K 5.0 12/20/2018 1432   CL 98 12/20/2018 1432   CO2 20 12/20/2018 1432   GLUCOSE 93 12/20/2018 1432   GLUCOSE 100 (H) 08/31/2018 0550   BUN 4 (L) 12/20/2018 1432   CREATININE 0.80 12/20/2018 1432   CALCIUM 9.4 12/20/2018 1432   GFRNONAA 125 12/20/2018 1432   GFRAA 145 12/20/2018 1432    Renal function: CrCl cannot be calculated (Unknown ideal weight.).  Clinical ASCVD: No  The ASCVD Risk score Denman George DC Jr., et al., 2013) failed to calculate for the following reasons:   The 2013 ASCVD risk score is only valid for ages 51 to 38  A/P: Hypertension undiagnosed. Pt with elevated BP ***. Patient is not on medications.   -{Meds adjust:18428} ***.  -F/u labs ordered - *** -Counseled on lifestyle modifications for blood pressure control including reduced dietary sodium, increased exercise, adequate sleep  Results reviewed and written information provided.   Total time in face-to-face counseling *** minutes.   F/U Clinic Visit in ***.  Patient seen with ***

## 2019-01-05 ENCOUNTER — Encounter: Payer: Self-pay | Admitting: Pharmacist

## 2019-03-21 ENCOUNTER — Ambulatory Visit: Payer: Self-pay | Admitting: Family Medicine

## 2019-07-26 ENCOUNTER — Other Ambulatory Visit: Payer: Self-pay

## 2019-07-26 ENCOUNTER — Emergency Department (HOSPITAL_COMMUNITY)
Admission: EM | Admit: 2019-07-26 | Discharge: 2019-07-26 | Disposition: A | Discharge status: Home / Self Care | Payer: Self-pay | Attending: Emergency Medicine | Admitting: Emergency Medicine

## 2019-07-26 ENCOUNTER — Encounter (HOSPITAL_COMMUNITY): Payer: Self-pay | Admitting: Emergency Medicine

## 2019-07-26 DIAGNOSIS — R1084 Generalized abdominal pain: Secondary | ICD-10-CM | POA: Insufficient documentation

## 2019-07-26 LAB — CBC
HCT: 45.3 % (ref 39.0–52.0)
Hemoglobin: 15.9 g/dL (ref 13.0–17.0)
MCH: 31.6 pg (ref 26.0–34.0)
MCHC: 35.1 g/dL (ref 30.0–36.0)
MCV: 90.1 fL (ref 80.0–100.0)
Platelets: 121 10*3/uL — ABNORMAL LOW (ref 150–400)
RBC: 5.03 MIL/uL (ref 4.22–5.81)
RDW: 13.1 % (ref 11.5–15.5)
WBC: 5 10*3/uL (ref 4.0–10.5)
nRBC: 0 % (ref 0.0–0.2)

## 2019-07-26 LAB — URINALYSIS, ROUTINE W REFLEX MICROSCOPIC
Bacteria, UA: NONE SEEN
Bilirubin Urine: NEGATIVE
Glucose, UA: NEGATIVE mg/dL
Hgb urine dipstick: NEGATIVE
Ketones, ur: NEGATIVE mg/dL
Nitrite: NEGATIVE
Protein, ur: NEGATIVE mg/dL
Specific Gravity, Urine: 1.008 (ref 1.005–1.030)
pH: 5 (ref 5.0–8.0)

## 2019-07-26 LAB — COMPREHENSIVE METABOLIC PANEL
ALT: 49 U/L — ABNORMAL HIGH (ref 0–44)
AST: 125 U/L — ABNORMAL HIGH (ref 15–41)
Albumin: 3.6 g/dL (ref 3.5–5.0)
Alkaline Phosphatase: 109 U/L (ref 38–126)
Anion gap: 16 — ABNORMAL HIGH (ref 5–15)
BUN: 5 mg/dL — ABNORMAL LOW (ref 6–20)
CO2: 23 mmol/L (ref 22–32)
Calcium: 8.7 mg/dL — ABNORMAL LOW (ref 8.9–10.3)
Chloride: 101 mmol/L (ref 98–111)
Creatinine, Ser: 0.8 mg/dL (ref 0.61–1.24)
GFR calc Af Amer: >60 mL/min (ref >60)
GFR calc non Af Amer: >60 mL/min (ref >60)
Glucose, Bld: 109 mg/dL — ABNORMAL HIGH (ref 70–99)
Potassium: 4 mmol/L (ref 3.5–5.1)
Sodium: 140 mmol/L (ref 135–145)
Total Bilirubin: 1.2 mg/dL (ref 0.3–1.2)
Total Protein: 8.4 g/dL — ABNORMAL HIGH (ref 6.5–8.1)

## 2019-07-26 LAB — LIPASE, BLOOD: Lipase: 45 U/L (ref 11–51)

## 2019-07-26 MED ORDER — SODIUM CHLORIDE 0.9 % IV BOLUS
1000.0000 mL | Freq: Once | INTRAVENOUS | Status: AC
  Administered 2019-07-26: 1000 mL via INTRAVENOUS

## 2019-07-26 MED ORDER — SODIUM CHLORIDE 0.9% FLUSH: 3.0000 mL | Freq: Once | INTRAVENOUS | Status: DC

## 2019-07-26 MED ORDER — ONDANSETRON HCL 4 MG/2ML IJ SOLN
4.0000 mg | Freq: Once | INTRAMUSCULAR | Status: AC
  Administered 2019-07-26: 4 mg via INTRAVENOUS
  Filled 2019-07-26: qty 2

## 2019-07-26 MED ORDER — SUCRALFATE 1 GM/10ML PO SUSP: 1.0000 g | Freq: Three times a day (TID) | ORAL | 0 refills | Status: DC

## 2019-07-26 MED ORDER — KETOROLAC TROMETHAMINE 30 MG/ML IJ SOLN
30.0000 mg | Freq: Once | INTRAMUSCULAR | Status: AC
  Administered 2019-07-26: 30 mg via INTRAVENOUS
  Filled 2019-07-26: qty 1

## 2019-07-26 MED ORDER — FAMOTIDINE 20 MG PO TABS: 20.0000 mg | ORAL_TABLET | Freq: Two times a day (BID) | ORAL | 0 refills | Status: DC

## 2019-07-26 NOTE — ED Triage Notes (Signed)
Pt from home w/ generalized abdominal pain X80months.  Was seen before did not follow up. Reports he has been drinking tonight, does drink every night

## 2019-07-26 NOTE — ED Provider Notes (Signed)
[facility] EMERGENCY DEPARTMENT Provider Note   CSN: [id number] Arrival date & time: 07/26/19  0123     History   Chief Complaint Chief Complaint  Patient presents with  . Abdominal Pain  . Emesis    HPI Jason Carney is a 23 y.o. male who presents for evaluation of abdominal pain that is ongoing for 2 weeks.  He states he has had this intermittent abdominal pain for the last 6 months and states that today symptoms feel similar.  He states it is on both sides and in the front.  He states it is an intermittent cramping sensation.  He states that it is worsened by lying down or when he drinks alcohol.  He does report that he drinks 10-20 beers a day.  He states his last drink was 2-3 beers yesterday.  He has never gone into seizures from withdrawal.  He states he will occasionally have some vomiting.  His last episode of vomiting was this morning.  He states that normally, there is no gross hematemesis but he has had a few episodes where he has had specks of blood.  He states that he has had this pain before and he is coming before, he got medications which helped his symptoms.  He states he has not had any fevers, dysuria, hematuria.  He has not had any constipation or diarrhea.  Patient denies any chest pain, difficulty breathing.     The history is provided by the patient.    Past Medical History:  Diagnosis Date  . Known health problems: none     Patient Active Problem List   Diagnosis Date Noted  . Thrombocytopenia ([place]) 12/20/2018  . Elevated blood pressure reading 12/20/2018  . Abnormal liver enzymes 12/20/2018    Past Surgical History:  Procedure Laterality Date  . NO PAST SURGERIES          Home Medications    Prior to Admission medications   Medication Sig Start Date End Date Taking? Authorizing Provider  famotidine (PEPCID) 20 MG tablet Take 1 tablet (20 mg total) by mouth 2 (two) times daily. 07/26/19   Volanda Napoleon, PA-C  sucralfate  (CARAFATE) 1 GM/10ML suspension Take 10 mLs (1 g total) by mouth 4 (four) times daily -  with meals and at bedtime. 07/26/19   Volanda Napoleon, PA-C    Family History Family History  Problem Relation Age of Onset  . Hypertension Neg Hx   . Diabetes Neg Hx   . Cancer Neg Hx   . Heart disease Neg Hx     Social History Social History   Tobacco Use  . Smoking status: Never Smoker  . Smokeless tobacco: Never Used  Substance Use Topics  . Alcohol use: Yes    Frequency: Never  . Drug use: No     Allergies   Patient has no known allergies.   Review of Systems Review of Systems  Constitutional: Negative for fever.  Respiratory: Negative for cough and shortness of breath.   Cardiovascular: Negative for chest pain.  Gastrointestinal: Positive for abdominal pain, nausea and vomiting. Negative for constipation and diarrhea.  Genitourinary: Negative for dysuria and hematuria.  Neurological: Negative for headaches.  All other systems reviewed and are negative.    Physical Exam Updated Vital Signs BP (!) 141/102 (BP Location: Left Arm)   Pulse 100   Temp 98.3 F (36.8 C) (Oral)   Resp 16   SpO2 95%   Physical Exam Vitals signs  and nursing note reviewed.  Constitutional:      Appearance: Normal appearance. He is well-developed.  HENT:     Head: Normocephalic and atraumatic.  Eyes:     General: Lids are normal.     Conjunctiva/sclera: Conjunctivae normal.     Pupils: Pupils are equal, round, and reactive to light.  Neck:     Musculoskeletal: Full passive range of motion without pain.  Cardiovascular:     Rate and Rhythm: Normal rate and regular rhythm.     Pulses: Normal pulses.     Heart sounds: Normal heart sounds. No murmur. No friction rub. No gallop.   Pulmonary:     Effort: Pulmonary effort is normal.     Breath sounds: Normal breath sounds.     Comments: Lungs clear to auscultation bilaterally.  Symmetric chest rise.  No wheezing, rales, rhonchi. Abdominal:      Palpations: Abdomen is soft. Abdomen is not rigid.     Tenderness: There is generalized abdominal tenderness. There is no right CVA tenderness, left CVA tenderness or guarding.     Comments: Abdomen is soft, non-distended.  Diffuse tenderness with no focal point.  No rigidity, guarding.  No CVA tenderness noted bilaterally.  Musculoskeletal: Normal range of motion.  Skin:    General: Skin is warm and dry.     Capillary Refill: Capillary refill takes less than 2 seconds.  Neurological:     Mental Status: He is alert and oriented to person, place, and time.  Psychiatric:        Speech: Speech normal.      ED Treatments / Results  Labs (all labs ordered are listed, but only abnormal results are displayed) Labs Reviewed  COMPREHENSIVE METABOLIC PANEL - Abnormal; Notable for the following components:      Result Value   Glucose, Bld 109 (*)    BUN 5 (*)    Calcium 8.7 (*)    Total Protein 8.4 (*)    AST 125 (*)    ALT 49 (*)    Anion gap 16 (*)    All other components within normal limits  CBC - Abnormal; Notable for the following components:   Platelets 121 (*)    All other components within normal limits  URINALYSIS, ROUTINE W REFLEX MICROSCOPIC - Abnormal; Notable for the following components:   Leukocytes,Ua SMALL (*)    All other components within normal limits  URINE CULTURE  LIPASE, BLOOD    EKG None  Radiology No results found.  Procedures Procedures (including critical care time)  Medications Ordered in ED Medications  sodium chloride flush (NS) 0.9 % injection 3 mL (has no administration in time range)  sodium chloride 0.9 % bolus 1,000 mL (0 mLs Intravenous Stopped 07/26/19 1034)  ondansetron (ZOFRAN) injection 4 mg (4 mg Intravenous Given 07/26/19 0850)  ketorolac (TORADOL) 30 MG/ML injection 30 mg (30 mg Intravenous Given 07/26/19 0903)     Initial Impression / Assessment and Plan / ED Course  I have reviewed the triage vital signs and the nursing  notes.  Pertinent labs & imaging results that were available during my care of the patient were reviewed by me and considered in my medical decision making (see chart for details).        23 year old male who presents for evaluation of abdominal pain.  He states is been ongoing for 2 weeks.  Reports similar history of abdominal pain for last 6 months.  No fevers, constipation, diarrhea.  He does report  he has had some intermittent vomiting.  On exam, he has some generalized tenderness no focal point.  Consider alcoholic gastritis versus viral GI process.  History/physical exam not concerning for small bowel obstruction, perforation, appendicitis, diverticulitis.  Will plan for labs, fluids, medications.  CMP shows normal BUN and creatinine.  AST and ALT are slightly elevated.  Review of his records show that this is consistent with previous.  His anion gap is slightly elevated at 16.  Suspect this is most likely due to alcohol.  Lipase is unremarkable.  CBC without any significant leukocytosis or anemia.  He does have evidence of thrombocytopenia.  Review of records show this been consistent with previous.  UA shows small leukocytes, pyuria.  We will plan to send for culture.  Doubt that this is contributing to his abdominal pain as he states is been ongoing issue denies any urinary complaints.  Reevaluation.  Patient reports he is feeling much better.  Repeat abdominal exam is benign.  He states that pain is gone.  He is talking on the phone without any signs of distress.  He would like to go home.  Patient has not had any more vomiting.  He does not wish to try p.o. challenge here in the emergency department.  At this time, his abdominal exam is benign.  No indication for CT abdomen is do not suspect surgical process. At this time, patient exhibits no emergent life-threatening condition that require further evaluation in ED or admission. Patient had ample opportunity for questions and discussion. All  patient's questions were answered with full understanding. Strict return precautions discussed. Patient expresses understanding and agreement to plan.   Portions of this note were generated with Scientist, clinical (histocompatibility and immunogenetics)Dragon dictation software. Dictation errors may occur despite best attempts at proofreading.   Final Clinical Impressions(s) / ED Diagnoses   Final diagnoses:  Generalized abdominal pain    ED Discharge Orders         Ordered    sucralfate (CARAFATE) 1 GM/10ML suspension  3 times daily with meals & bedtime     07/26/19 1018    famotidine (PEPCID) 20 MG tablet  2 times daily     07/26/19 1018           Rosana HoesLayden, Lindsey A, PA-C 07/26/19 1103    Geoffery Lyonselo, Douglas, MD 07/26/19 1603

## 2019-07-26 NOTE — Discharge Instructions (Signed)
Take Carafate and Pepcid as directed.  Make sure you are drinking plenty of fluids.  You follow-up with GI regarding your symptoms.  Return to the Emergency Department immediately if you experience any worsening abdominal pain, fever, persistent nausea and vomiting, inability keep any food down, pain with urination, blood in your urine or any other worsening or concerning symptoms.

## 2019-07-27 ENCOUNTER — Emergency Department (HOSPITAL_COMMUNITY)
Admission: EM | Admit: 2019-07-27 | Discharge: 2019-07-27 | Disposition: A | Discharge status: Home / Self Care | Payer: Self-pay | Attending: Emergency Medicine | Admitting: Emergency Medicine

## 2019-07-27 DIAGNOSIS — Z7689 Persons encountering health services in other specified circumstances: Secondary | ICD-10-CM | POA: Insufficient documentation

## 2019-07-27 DIAGNOSIS — Z79899 Other long term (current) drug therapy: Secondary | ICD-10-CM

## 2019-07-27 LAB — URINE CULTURE: Culture: <10000 — AB

## 2019-07-27 MED ORDER — SUCRALFATE 1 GM/10ML PO SUSP: 1.0000 g | Freq: Three times a day (TID) | ORAL | 0 refills | Status: AC

## 2019-07-27 MED ORDER — FAMOTIDINE 20 MG PO TABS: 20.0000 mg | ORAL_TABLET | Freq: Two times a day (BID) | ORAL | 0 refills | Status: AC

## 2019-07-27 MED FILL — SUCRALFATE 1 GM/10ML SUSP: 1 | 10 days supply | Qty: 420 | Fill #0

## 2019-07-27 MED FILL — FAMOTIDINE 20 MG TABS: 20 | 7 days supply | Qty: 15 | Fill #0

## 2019-07-27 NOTE — ED Provider Notes (Signed)
[facility] EMERGENCY DEPARTMENT Provider Note   CSN: [id number] Arrival date & time: 07/27/19  1250     History   Chief Complaint No chief complaint on file.   HPI Jason Carney is a 23 y.o. male who presents for prescription for medicines.  Patient was evaluated yesterday for abdominal pain intoxicated on alcohol.  He was evaluated, given fluids, and discharged home with Pepcid and Carafate.  They were sent to the wrong pharmacy.  Patient here to have this corrected.  Patient denies any new symptoms and reports he is feeling better today.     HPI  Past Medical History:  Diagnosis Date  . Known health problems: none     Patient Active Problem List   Diagnosis Date Noted  . Thrombocytopenia ([place]) 12/20/2018  . Elevated blood pressure reading 12/20/2018  . Abnormal liver enzymes 12/20/2018    Past Surgical History:  Procedure Laterality Date  . NO PAST SURGERIES          Home Medications    Prior to Admission medications   Medication Sig Start Date End Date Taking? Authorizing Provider  famotidine (PEPCID) 20 MG tablet Take 1 tablet (20 mg total) by mouth 2 (two) times daily. 07/27/19   Conrad Zajkowski, Bea Graff, PA-C  sucralfate (CARAFATE) 1 GM/10ML suspension Take 10 mLs (1 g total) by mouth 4 (four) times daily -  with meals and at bedtime. 07/27/19   Frederica Kuster, PA-C    Family History Family History  Problem Relation Age of Onset  . Hypertension Neg Hx   . Diabetes Neg Hx   . Cancer Neg Hx   . Heart disease Neg Hx     Social History Social History   Tobacco Use  . Smoking status: Never Smoker  . Smokeless tobacco: Never Used  Substance Use Topics  . Alcohol use: Yes    Frequency: Never  . Drug use: No     Allergies   Patient has no known allergies.   Review of Systems Review of Systems  Constitutional: Negative for fever.  Gastrointestinal: Positive for abdominal pain.     Physical Exam Updated Vital Signs BP (!)  147/111 (BP Location: Left Arm)   Pulse 91   Temp 98.5 F (36.9 C) (Oral)   Resp 16   SpO2 100%   Physical Exam Constitutional:      Appearance: Normal appearance.  HENT:     Head: Normocephalic and atraumatic.  Eyes:     General: No scleral icterus.       Right eye: No discharge.        Left eye: No discharge.     Conjunctiva/sclera: Conjunctivae normal.  Neck:     Musculoskeletal: Normal range of motion.  Cardiovascular:     Rate and Rhythm: Normal rate.  Pulmonary:     Effort: Pulmonary effort is normal. No respiratory distress.  Abdominal:     General: Abdomen is flat. There is no distension.     Palpations: Abdomen is soft.     Tenderness: There is no abdominal tenderness. There is no guarding or rebound.  Musculoskeletal: Normal range of motion.  Skin:    General: Skin is warm.  Neurological:     Mental Status: He is alert and oriented to person, place, and time.      ED Treatments / Results  Labs (all labs ordered are listed, but only abnormal results are displayed) Labs Reviewed - No data to display  EKG None  Radiology No results found.  Procedures Procedures (including critical care time)  Medications Ordered in ED Medications - No data to display   Initial Impression / Assessment and Plan / ED Course  I have reviewed the triage vital signs and the nursing notes.  Pertinent labs & imaging results that were available during my care of the patient were reviewed by me and considered in my medical decision making (see chart for details).        Patient presenting for prescription of medicines that were sent to the wrong pharmacy yesterday.  Pepcid and Carafate sent to the correct pharmacy today.  Patient has no further complaints.  Patient had elevated blood pressure today.  It was not as significantly elevated yesterday.  On repeat it has gone down.  Follow-up to PCP for recheck of this and further management.  Return precautions discussed.   Patient vitals stable and discharged in satisfactory condition.  Final Clinical Impressions(s) / ED Diagnoses   Final diagnoses:  Medication management    ED Discharge Orders         Ordered    famotidine (PEPCID) 20 MG tablet  2 times daily     07/27/19 1337    sucralfate (CARAFATE) 1 GM/10ML suspension  3 times daily with meals & bedtime     07/27/19 1337           Emi Holes, PA-C 07/27/19 1521    Terald Sleeper, MD 07/27/19 [phone]

## 2019-07-27 NOTE — Discharge Planning (Signed)
ED CM consulted for medication assistance. EDCM reviewed chart and spoke with the pt about [facility] MATCH program ($3 co pay for each Rx through [facility] program, does not include refills, 7 day expiration of [place] letter and choice of pharmacies). Pt is eligible for [facility] MATCH program (unable to find pt listed in PROCARE per cardholder name inquiry) and has agreed to accept [facility] with co-pay override.  EDCM enrolled pt with co-pay override.  Rx sent to [facility].  EDCM will pickup and deliver to pt prior to discharge today.

## 2019-07-27 NOTE — ED Triage Notes (Signed)
Pt back today asking for help to obtain his meds that were prescribed yesterday , Case mgmt to help

## 2019-07-27 NOTE — Discharge Instructions (Signed)
Please have your blood pressure rechecked at the primary care provider office when you see them.  It was elevated today.  If this continues, you may need to start medication.  Please return the emergency department if you develop any new or worsening symptoms.

## 2019-11-28 ENCOUNTER — Emergency Department (HOSPITAL_COMMUNITY)
Admission: EM | Admit: 2019-11-28 | Discharge: 2019-11-28 | Disposition: A | Discharge status: Home / Self Care | Payer: Self-pay | Attending: Emergency Medicine | Admitting: Emergency Medicine

## 2019-11-28 ENCOUNTER — Other Ambulatory Visit: Payer: Self-pay

## 2019-11-28 ENCOUNTER — Encounter (HOSPITAL_COMMUNITY): Payer: Self-pay | Admitting: Emergency Medicine

## 2019-11-28 DIAGNOSIS — R1031 Right lower quadrant pain: Secondary | ICD-10-CM | POA: Insufficient documentation

## 2019-11-28 DIAGNOSIS — R1032 Left lower quadrant pain: Secondary | ICD-10-CM | POA: Insufficient documentation

## 2019-11-28 DIAGNOSIS — Z5321 Procedure and treatment not carried out due to patient leaving prior to being seen by health care provider: Secondary | ICD-10-CM | POA: Insufficient documentation

## 2019-11-28 LAB — URINALYSIS, ROUTINE W REFLEX MICROSCOPIC
Bilirubin Urine: NEGATIVE
Glucose, UA: NEGATIVE mg/dL
Hgb urine dipstick: NEGATIVE
Ketones, ur: NEGATIVE mg/dL
Leukocytes,Ua: NEGATIVE
Nitrite: NEGATIVE
Protein, ur: NEGATIVE mg/dL
Specific Gravity, Urine: 1.005 (ref 1.005–1.030)
pH: 5 (ref 5.0–8.0)

## 2019-11-28 LAB — CBC
HCT: 41.6 % (ref 39.0–52.0)
Hemoglobin: 14.3 g/dL (ref 13.0–17.0)
MCH: 30.3 pg (ref 26.0–34.0)
MCHC: 34.4 g/dL (ref 30.0–36.0)
MCV: 88.1 fL (ref 80.0–100.0)
Platelets: 126 10*3/uL — ABNORMAL LOW (ref 150–400)
RBC: 4.72 MIL/uL (ref 4.22–5.81)
RDW: 12.1 % (ref 11.5–15.5)
WBC: 4.1 10*3/uL (ref 4.0–10.5)
nRBC: 0 % (ref 0.0–0.2)

## 2019-11-28 LAB — BASIC METABOLIC PANEL
Anion gap: 12 (ref 5–15)
BUN: 5 mg/dL — ABNORMAL LOW (ref 6–20)
CO2: 22 mmol/L (ref 22–32)
Calcium: 8.7 mg/dL — ABNORMAL LOW (ref 8.9–10.3)
Chloride: 100 mmol/L (ref 98–111)
Creatinine, Ser: 0.69 mg/dL (ref 0.61–1.24)
GFR calc Af Amer: >60 mL/min (ref >60)
GFR calc non Af Amer: >60 mL/min (ref >60)
Glucose, Bld: 114 mg/dL — ABNORMAL HIGH (ref 70–99)
Potassium: 4 mmol/L (ref 3.5–5.1)
Sodium: 134 mmol/L — ABNORMAL LOW (ref 135–145)

## 2019-11-28 NOTE — ED Notes (Signed)
Pt called 3 times, no answer 

## 2019-11-28 NOTE — ED Triage Notes (Signed)
Pt arrives via EMS from home with bilateral flank pain starting today. Using interpreter, pt states that when he sits for a long time, his back feels like it is burning. Sides are worse with palpation. Denies urinary complaints.

## 2019-11-29 ENCOUNTER — Encounter (HOSPITAL_COMMUNITY): Payer: Self-pay

## 2019-11-29 ENCOUNTER — Emergency Department (HOSPITAL_COMMUNITY)
Admission: EM | Admit: 2019-11-29 | Discharge: 2019-11-29 | Disposition: A | Discharge status: Home / Self Care | Payer: Self-pay | Attending: Emergency Medicine | Admitting: Emergency Medicine

## 2019-11-29 DIAGNOSIS — M545 Low back pain, unspecified: Secondary | ICD-10-CM

## 2019-11-29 NOTE — ED Provider Notes (Signed)
[facility] EMERGENCY DEPARTMENT Provider Note   CSN: [id number] Arrival date & time: 11/29/19  0046     History Chief Complaint  Patient presents with  . Flank Pain    Jason Carney is a 24 y.o. male.  HPI     Jason Carney is a 24 y.o. male, presenting to the ED with lower back pain beginning yesterday.  Pain is described as a soreness, bilateral, worse with bending or twisting, moderate in intensity.  States it began after a particularly physically demanding day at his job. He has not tried any therapies for his complaint.  He is pain-free during my assessment. Denies fever/chills, abdominal pain, urinary symptoms, numbness, weakness, chest pain, falls/trauma, N/V/D, changes in bowel or bladder function, saddle anesthesias, or any other complaints.    Past Medical History:  Diagnosis Date  . Known health problems: none     Patient Active Problem List   Diagnosis Date Noted  . Thrombocytopenia (HCC) 12/20/2018  . Elevated blood pressure reading 12/20/2018  . Abnormal liver enzymes 12/20/2018    Past Surgical History:  Procedure Laterality Date  . NO PAST SURGERIES         Family History  Problem Relation Age of Onset  . Hypertension Neg Hx   . Diabetes Neg Hx   . Cancer Neg Hx   . Heart disease Neg Hx     Social History   Tobacco Use  . Smoking status: Never Smoker  . Smokeless tobacco: Never Used  Substance Use Topics  . Alcohol use: Yes  . Drug use: No    Home Medications Prior to Admission medications   Medication Sig Start Date End Date Taking? Authorizing Provider  famotidine (PEPCID) 20 MG tablet Take 1 tablet (20 mg total) by mouth 2 (two) times daily. 07/27/19   Law, Waylan Boga, PA-C  sucralfate (CARAFATE) 1 GM/10ML suspension Take 10 mLs (1 g total) by mouth 4 (four) times daily -  with meals and at bedtime. 07/27/19   Emi Holes, PA-C    Allergies    Patient has no known allergies.  Review of Systems     Review of Systems  Constitutional: Negative for chills, diaphoresis and fever.  Respiratory: Negative for cough and shortness of breath.   Cardiovascular: Negative for chest pain.  Gastrointestinal: Negative for abdominal pain, diarrhea, nausea and vomiting.  Genitourinary: Negative for difficulty urinating, dysuria, flank pain, frequency, hematuria and testicular pain.  Musculoskeletal: Positive for back pain.  Neurological: Negative for weakness and numbness.  All other systems reviewed and are negative.   Physical Exam Updated Vital Signs BP 121/88 (BP Location: Left Arm)   Pulse 89   Temp 98.4 F (36.9 C) (Oral)   Resp 17   SpO2 100%   Physical Exam Vitals and nursing note reviewed.  Constitutional:      General: He is not in acute distress.    Appearance: He is well-developed. He is not diaphoretic.  HENT:     Head: Normocephalic and atraumatic.     Mouth/Throat:     Mouth: Mucous membranes are moist.     Pharynx: Oropharynx is clear.  Eyes:     Conjunctiva/sclera: Conjunctivae normal.  Cardiovascular:     Rate and Rhythm: Normal rate and regular rhythm.     Pulses: Normal pulses.          Radial pulses are 2+ on the right side and 2+ on the left side.  Posterior tibial pulses are 2+ on the right side and 2+ on the left side.     Heart sounds: Normal heart sounds.     Comments: Tactile temperature in the extremities appropriate and equal bilaterally. Pulmonary:     Effort: Pulmonary effort is normal. No respiratory distress.     Breath sounds: Normal breath sounds.  Abdominal:     Palpations: Abdomen is soft.     Tenderness: There is no abdominal tenderness. There is no right CVA tenderness, left CVA tenderness or guarding.  Musculoskeletal:     Cervical back: Neck supple.     Right lower leg: No edema.     Left lower leg: No edema.     Comments: No tenderness anywhere in the back.  Patient remained pain-free throughout his ED course. Normal motor  function intact in all extremities. No midline spinal tenderness.   Lymphadenopathy:     Cervical: No cervical adenopathy.  Skin:    General: Skin is warm and dry.  Neurological:     Mental Status: He is alert.     Comments: Sensation grossly intact to light touch in the lower extremities bilaterally. No saddle anesthesias. Strength 5/5 in the bilateral lower extremities. No noted gait deficit. Coordination intact.  Psychiatric:        Mood and Affect: Mood and affect normal.        Speech: Speech normal.        Behavior: Behavior normal.     ED Results / Procedures / Treatments   Labs (all labs ordered are listed, but only abnormal results are displayed) Labs Reviewed - No data to display  Ref Range & Units 11/28/19 4 mo ago 11 mo ago 1 yr ago  WBC 4.0 - 10.5 K/uL 4.1  5.0  4.1 R  7.1   RBC 4.22 - 5.81 MIL/uL 4.72  5.03  5.78 R, CM  5.37   Hemoglobin 13.0 - 17.0 g/dL 33.2  95.1  88.4ZYSA  R  16.3   HCT 39.0 - 52.0 % 41.6  45.3  48.1 R  46.9   MCV 80.0 - 100.0 fL 88.1  90.1  83 R  87.3   MCH 26.0 - 34.0 pg 30.3  31.6  30.8 R  30.4   MCHC 30.0 - 36.0 g/dL 63.0  16.0  10.9NATF  R  34.8   RDW 11.5 - 15.5 % 12.1  13.1  15.1 R  11.4Low    Platelets 150 - 400 K/uL 126Low   121Low   212 R  117Low  CM   nRBC 0.0 - 0.2 % 0.0        Basic metabolic panel Order: [id number] Status:  Final result Visible to patient:  No (inaccessible in MyChart) Next appt:  None  Ref Range & Units 11/28/19 4 mo ago 11 mo ago 1 yr ago  Sodium 135 - 145 mmol/L 134Low   140  138 R  138   Potassium 3.5 - 5.1 mmol/L 4.0  4.0  5.0 R  3.9   Chloride 98 - 111 mmol/L 100  101  98 R  103   CO2 22 - 32 mmol/L 22  23  20  R  19Low    Glucose, Bld 70 - 99 mg/dL   270WCBJ   93 R  100High    Comment: Glucose reference range applies only to samples taken after fasting for at least 8 hours.  BUN 6 - 20 mg/dL 5Low   5Low  4Low   10   Creatinine, Ser 0.61 - 1.24 mg/dL 0.69  0.80  0.80 R  0.91   Calcium 8.9 - 10.3  mg/dL 8.7Low   8.7Low   9.4 R  9.7   GFR calc non Af Amer >60 mL/min >60  >60  125 R  >60   GFR calc Af Amer >60 mL/min >60  >60  145 R  >60   Anion gap 5 - 15 12  16High  CM   16High  CM        Urinalysis, Routine w reflex microscopic- may I&O cath if menses Order: [id number] Status:  Final result Visible to patient:  No (inaccessible in MyChart) Next appt:  None  Ref Range & Units  (11/28/19) 4 mo ago  (07/26/19) 2 yr ago  (10/29/17) 2 yr ago  (07/07/17)  Color, Urine YELLOW YELLOW  YELLOW  YELLOW  YELLOW   APPearance CLEAR CLEAR  CLEAR  CLEAR  HAZYAbnormal    Specific Gravity, Urine 1.005 - 1.030 1.005  1.008  1.008  1.019   pH 5.0 - 8.0 5.0  5.0  5.0  5.0   Glucose, UA NEGATIVE mg/dL NEGATIVE  NEGATIVE  NEGATIVE  NEGATIVE   Hgb urine dipstick NEGATIVE NEGATIVE  NEGATIVE  SMALLAbnormal   SMALLAbnormal    Bilirubin Urine NEGATIVE NEGATIVE  NEGATIVE  NEGATIVE  NEGATIVE   Ketones, ur NEGATIVE mg/dL NEGATIVE  NEGATIVE  NEGATIVE  5Abnormal    Protein, ur NEGATIVE mg/dL NEGATIVE  NEGATIVE  NEGATIVE  NEGATIVE   Nitrite NEGATIVE NEGATIVE  NEGATIVE  NEGATIVE  NEGATIVE   Leukocytes,Ua NEGATIVE NEGATIVE  SMALLAbnormal             EKG None  Radiology No results found.  Procedures Procedures (including critical care time)  Medications Ordered in ED Medications - No data to display  ED Course  I have reviewed the triage vital signs and the nursing notes.  Pertinent labs & imaging results that were available during my care of the patient were reviewed by me and considered in my medical decision making (see chart for details).    MDM Rules/Calculators/A&P                      Patient presents with intermittent back pain.  No pain during ED course.  No focal neurologic deficits on exam.  I personally reviewed and interpreted the patient's lab results.  All lab results reassuring. Recommend PCP follow-up.  Resources given.  The patient was given instructions for home care as  well as return precautions. Patient voices understanding of these instructions, accepts the plan, and is comfortable with discharge.   Final Clinical Impression(s) / ED Diagnoses Final diagnoses:  Acute bilateral low back pain without sciatica    Rx / DC Orders ED Discharge Orders    None       Layla Maw 12/01/19 0010    Tegeler, Gwenyth Allegra, MD 12/01/19 1742

## 2019-11-29 NOTE — ED Triage Notes (Signed)
Pt states that he has bilateral flank pain that has been going on for months, denies urinary symptoms.

## 2019-11-29 NOTE — Discharge Instructions (Addendum)
  Expect your soreness to increase over the next 2-3 days. Take it easy, but do not lay around too much as this may make any stiffness worse.  Acetaminophen: May take acetaminophen (generic for Tylenol), as needed, for pain. Your daily total maximum amount of acetaminophen from all sources should be limited to 4000mg/day for persons without liver problems, or 2000mg/day for those with liver problems. Methocarbamol: Methocarbamol (generic for Robaxin) is a muscle relaxer and can help relieve stiff muscles or muscle spasms.  Do not drive or perform other dangerous activities while taking this medication as it can cause drowsiness as well as changes in reaction time and judgement. Lidocaine patches: These are available via either prescription or over-the-counter. The over-the-counter option may be more economical one and are likely just as effective. There are multiple over-the-counter brands, such as Salonpas. Ice: May apply ice to the area over the next 24 hours for 15 minutes at a time to reduce pain, inflammation, and swelling, if present. Exercises: Be sure to perform the attached exercises starting with three times a week and working up to performing them daily. This is an essential part of preventing long term problems.  Follow up: Follow up with a primary care provider for any future management of these complaints. Be sure to follow up within 7-10 days. Return: Return to the ED should symptoms worsen.  For prescription assistance, may try using prescription discount sites or apps, such as goodrx.com 

## 2020-01-11 ENCOUNTER — Emergency Department (HOSPITAL_COMMUNITY)
Admission: EM | Admit: 2020-01-11 | Discharge: 2020-01-11 | Disposition: A | Discharge status: Home / Self Care | Payer: Self-pay | Attending: Emergency Medicine | Admitting: Emergency Medicine

## 2020-01-11 ENCOUNTER — Other Ambulatory Visit: Payer: Self-pay

## 2020-01-11 ENCOUNTER — Emergency Department (HOSPITAL_COMMUNITY): Payer: Self-pay

## 2020-01-11 DIAGNOSIS — R569 Unspecified convulsions: Secondary | ICD-10-CM | POA: Insufficient documentation

## 2020-01-11 DIAGNOSIS — Z20822 Contact with and (suspected) exposure to covid-19: Secondary | ICD-10-CM | POA: Insufficient documentation

## 2020-01-11 LAB — CBC WITH DIFFERENTIAL/PLATELET
Abs Immature Granulocytes: 0.06 10*3/uL (ref 0.00–0.07)
Basophils Absolute: 0 10*3/uL (ref 0.0–0.1)
Basophils Relative: 1 %
Eosinophils Absolute: 0.1 10*3/uL (ref 0.0–0.5)
Eosinophils Relative: 2 %
HCT: 41.5 % (ref 39.0–52.0)
Hemoglobin: 14.4 g/dL (ref 13.0–17.0)
Immature Granulocytes: 1 %
Lymphocytes Relative: 42 %
Lymphs Abs: 2.3 10*3/uL (ref 0.7–4.0)
MCH: 32.9 pg (ref 26.0–34.0)
MCHC: 34.7 g/dL (ref 30.0–36.0)
MCV: 94.7 fL (ref 80.0–100.0)
Monocytes Absolute: 0.7 10*3/uL (ref 0.1–1.0)
Monocytes Relative: 13 %
Neutro Abs: 2.2 10*3/uL (ref 1.7–7.7)
Neutrophils Relative %: 41 %
Platelets: 85 10*3/uL — ABNORMAL LOW (ref 150–400)
RBC: 4.38 MIL/uL (ref 4.22–5.81)
RDW: 10.9 % — ABNORMAL LOW (ref 11.5–15.5)
WBC: 5.4 10*3/uL (ref 4.0–10.5)
nRBC: 0 % (ref 0.0–0.2)

## 2020-01-11 LAB — COMPREHENSIVE METABOLIC PANEL
ALT: 50 U/L — ABNORMAL HIGH (ref 0–44)
AST: 138 U/L — ABNORMAL HIGH (ref 15–41)
Albumin: 3.5 g/dL (ref 3.5–5.0)
Alkaline Phosphatase: 76 U/L (ref 38–126)
Anion gap: 21 — ABNORMAL HIGH (ref 5–15)
BUN: 5 mg/dL — ABNORMAL LOW (ref 6–20)
CO2: 15 mmol/L — ABNORMAL LOW (ref 22–32)
Calcium: 8.8 mg/dL — ABNORMAL LOW (ref 8.9–10.3)
Chloride: 98 mmol/L (ref 98–111)
Creatinine, Ser: 0.89 mg/dL (ref 0.61–1.24)
GFR calc Af Amer: >60 mL/min (ref >60)
GFR calc non Af Amer: >60 mL/min (ref >60)
Glucose, Bld: 114 mg/dL — ABNORMAL HIGH (ref 70–99)
Potassium: 3.9 mmol/L (ref 3.5–5.1)
Sodium: 134 mmol/L — ABNORMAL LOW (ref 135–145)
Total Bilirubin: 1.4 mg/dL — ABNORMAL HIGH (ref 0.3–1.2)
Total Protein: 8.1 g/dL (ref 6.5–8.1)

## 2020-01-11 LAB — POCT I-STAT EG7
Acid-Base Excess: 0 mmol/L (ref 0.0–2.0)
Bicarbonate: 24.8 mmol/L (ref 20.0–28.0)
Calcium, Ion: 1.11 mmol/L — ABNORMAL LOW (ref 1.15–1.40)
HCT: 38 % — ABNORMAL LOW (ref 39.0–52.0)
Hemoglobin: 12.9 g/dL — ABNORMAL LOW (ref 13.0–17.0)
O2 Saturation: 81 %
Potassium: 4 mmol/L (ref 3.5–5.1)
Sodium: 137 mmol/L (ref 135–145)
TCO2: 26 mmol/L (ref 22–32)
pCO2, Ven: 39.2 mmHg — ABNORMAL LOW (ref 44.0–60.0)
pH, Ven: 7.409 (ref 7.250–7.430)
pO2, Ven: 45 mmHg (ref 32.0–45.0)

## 2020-01-11 LAB — RAPID URINE DRUG SCREEN, HOSP PERFORMED
Amphetamines: NOT DETECTED
Barbiturates: NOT DETECTED
Benzodiazepines: NOT DETECTED
Cocaine: NOT DETECTED
Opiates: NOT DETECTED
Tetrahydrocannabinol: NOT DETECTED

## 2020-01-11 LAB — SARS CORONAVIRUS 2 BY RT PCR (HOSPITAL ORDER, PERFORMED IN ~~LOC~~ HOSPITAL LAB): SARS Coronavirus 2: NEGATIVE

## 2020-01-11 LAB — CBG MONITORING, ED: Glucose-Capillary: 114 mg/dL — ABNORMAL HIGH (ref 70–99)

## 2020-01-11 LAB — ETHANOL: Alcohol, Ethyl (B): 76 mg/dL — ABNORMAL HIGH (ref <10)

## 2020-01-11 MED ORDER — THIAMINE HCL 100 MG/ML IJ SOLN
100.0000 mg | Freq: Once | INTRAMUSCULAR | Status: AC
  Administered 2020-01-11: 100 mg via INTRAVENOUS
  Filled 2020-01-11: qty 2

## 2020-01-11 MED ORDER — LORAZEPAM 2 MG/ML IJ SOLN
1.0000 mg | Freq: Once | INTRAMUSCULAR | Status: AC
  Administered 2020-01-11: 1 mg via INTRAVENOUS
  Filled 2020-01-11: qty 1

## 2020-01-11 MED ORDER — LORAZEPAM 2 MG/ML IJ SOLN: 0.0000 mg | Freq: Four times a day (QID) | INTRAMUSCULAR | Status: DC

## 2020-01-11 MED ORDER — LORAZEPAM 1 MG PO TABS: 0.0000 mg | ORAL_TABLET | Freq: Two times a day (BID) | ORAL | Status: DC

## 2020-01-11 MED ORDER — SODIUM CHLORIDE 0.9 % IV BOLUS
1000.0000 mL | Freq: Once | INTRAVENOUS | Status: AC
  Administered 2020-01-11: 1000 mL via INTRAVENOUS

## 2020-01-11 MED ORDER — LORAZEPAM 1 MG PO TABS
0.0000 mg | ORAL_TABLET | Freq: Four times a day (QID) | ORAL | Status: DC
  Administered 2020-01-11: 1 mg via ORAL
  Filled 2020-01-11: qty 1

## 2020-01-11 MED ORDER — LORAZEPAM 2 MG/ML IJ SOLN: 0.0000 mg | Freq: Two times a day (BID) | INTRAMUSCULAR | Status: DC

## 2020-01-11 MED ORDER — CHLORDIAZEPOXIDE HCL 25 MG PO CAPS
ORAL_CAPSULE | ORAL | 0 refills | Status: DC
Start: 2020-01-11 — End: 2021-07-04

## 2020-01-11 NOTE — ED Notes (Signed)
Patient verbalizes understanding of discharge instructions. Opportunity for questioning and answers were provided. Armband removed by staff, pt discharged from ED.  

## 2020-01-11 NOTE — Discharge Instructions (Signed)

## 2020-01-11 NOTE — ED Triage Notes (Signed)
Pt brought in by [facility] after a bystander called about seeing pt laying in the middle of the road. Bystander endorses seizure like activity. Pt has hx of etoh use, denies drinking today, states he only drinks on the weekend. Pt oriented to self and place. Pt trembling during triage, HR 120.

## 2020-01-11 NOTE — ED Provider Notes (Signed)
Emergency Department Provider Note   I have reviewed the triage vital signs and the nursing notes.   HISTORY  Chief Complaint Seizures  Patient requesting to speak [place]. Interpreter offered.   HPI Jason Carney is a 24 y.o. male with PMH of EtOH use presents to the emergency department for evaluation of witnessed seizure.  He was apparently found by EMS in the street having a seizure.  Patient tells me that he is not sure what happened.  He denies drinking alcohol today and states that he only drinks occasionally.  No recent significant cut back on his alcohol intake.  EMS arrived and seizure stopped without intervention.  He denies chest pain, pain in the arms or legs, headache, neck pain, or fevers recently.  He tells me that he does have a history of seizure but is not taking seizure medicines.  He is not sure if these were ever prescribed to him in the first place. No tongue pain/injury. Denies drug use.   Past Medical History:  Diagnosis Date  . Known health problems: none     Patient Active Problem List   Diagnosis Date Noted  . Thrombocytopenia ([place]) 12/20/2018  . Elevated blood pressure reading 12/20/2018  . Abnormal liver enzymes 12/20/2018    Past Surgical History:  Procedure Laterality Date  . NO PAST SURGERIES      Allergies Patient has no known allergies.  Family History  Problem Relation Age of Onset  . Hypertension Neg Hx   . Diabetes Neg Hx   . Cancer Neg Hx   . Heart disease Neg Hx     Social History Social History   Tobacco Use  . Smoking status: Never Smoker  . Smokeless tobacco: Never Used  Substance Use Topics  . Alcohol use: Yes  . Drug use: No    Review of Systems  Constitutional: No fever/chills Eyes: No visual changes. ENT: No sore throat. Cardiovascular: Denies chest pain. Respiratory: Denies shortness of breath. Gastrointestinal: No abdominal pain. Genitourinary: Negative for dysuria. Musculoskeletal: Negative for back  pain. Skin: Negative for rash. Neurological: Negative for headaches, focal weakness or numbness. Positive seizure activity.   10-point ROS otherwise negative.  ____________________________________________   PHYSICAL EXAM:  VITAL SIGNS: Vitals:   01/11/20 2030 01/11/20 2100  BP:    Pulse: 89   Resp: 18 19  SpO2: 99%   Temp: 98.6 F BP: 142/92  Constitutional: Alert and oriented. Well appearing and in no acute distress. Eyes: Conjunctivae are normal. PERRL.  Head: Atraumatic. Nose: No congestion/rhinnorhea. Mouth/Throat: Mucous membranes are moist.  Oropharynx non-erythematous. No tongue injury.  Neck: No stridor. C-collar in place.  Cardiovascular: Tachycardia. Good peripheral circulation. Grossly normal heart sounds.   Respiratory: Normal respiratory effort.  No retractions. Lungs CTAB. Gastrointestinal: Soft and nontender. No distention.  Musculoskeletal: No lower extremity tenderness nor edema. No gross deformities of extremities. Neurologic:  Normal speech and language. No gross focal neurologic deficits are appreciated.  Skin:  Skin is warm, dry and intact. No rash noted.  ____________________________________________   LABS (all labs ordered are listed, but only abnormal results are displayed)  Labs Reviewed  COMPREHENSIVE METABOLIC PANEL - Abnormal; Notable for the following components:      Result Value   Sodium 134 (*)    CO2 15 (*)    Glucose, Bld 114 (*)    BUN 5 (*)    Calcium 8.8 (*)    AST 138 (*)    ALT 50 (*)  Total Bilirubin 1.4 (*)    Anion gap 21 (*)    All other components within normal limits  ETHANOL - Abnormal; Notable for the following components:   Alcohol, Ethyl (B) 76 (*)    All other components within normal limits  CBC WITH DIFFERENTIAL/PLATELET - Abnormal; Notable for the following components:   RDW 10.9 (*)    Platelets 85 (*)    All other components within normal limits  CBG MONITORING, ED - Abnormal; Notable for the following  components:   Glucose-Capillary 114 (*)    All other components within normal limits  POCT I-STAT EG7 - Abnormal; Notable for the following components:   pCO2, Ven 39.2 (*)    Calcium, Ion 1.11 (*)    HCT 38.0 (*)    Hemoglobin 12.9 (*)    All other components within normal limits  SARS CORONAVIRUS 2 BY RT PCR (HOSPITAL ORDER, PERFORMED IN [facility])  RAPID URINE DRUG SCREEN, HOSP PERFORMED  BLOOD GAS, VENOUS   ____________________________________________  EKG   EKG Interpretation  Date/Time:  Wednesday Jan 11 2020 14:51:46 EDT Ventricular Rate:  115 PR Interval:    QRS Duration: 93 QT Interval:  334 QTC Calculation: 462 R Axis:   80 Text Interpretation: Sinus tachycardia Ventricular premature complex Aberrant complex Inferior infarct, age indeterminate Probable lateral infarct, old No STEMI Confirmed by Alona Bene [id number]) on 01/11/2020 2:57:11 PM       ____________________________________________  RADIOLOGY  CT Head Wo Contrast  Result Date: 01/11/2020 CLINICAL DATA:  Possible seizure. EXAM: CT HEAD WITHOUT CONTRAST TECHNIQUE: Contiguous axial images were obtained from the base of the skull through the vertex without intravenous contrast. COMPARISON:  None. FINDINGS: Brain: There is mild cerebral atrophy with widening of the extra-axial spaces and ventricular dilatation. There are areas of decreased attenuation within the white matter tracts of the supratentorial brain, consistent with microvascular disease changes. Vascular: No hyperdense vessel or unexpected calcification. Skull: Normal. Negative for fracture or focal lesion. Sinuses/Orbits: No acute finding. Other: Mild scalp soft tissue swelling is seen along the posterior aspect of the vertex on the left. IMPRESSION: 1. Mild scalp soft tissue swelling along the posterior aspect of the vertex on the left. 2. No acute intracranial abnormality. Electronically Signed   By: Aram Candela M.D.   On: 01/11/2020  16:23   CT Cervical Spine Wo Contrast  Result Date: 01/11/2020 CLINICAL DATA:  Found laying in the middle of the road. EXAM: CT CERVICAL SPINE WITHOUT CONTRAST TECHNIQUE: Multidetector CT imaging of the cervical spine was performed without intravenous contrast. Multiplanar CT image reconstructions were also generated. COMPARISON:  None. FINDINGS: Alignment: Normal. Skull base and vertebrae: No acute fracture. No primary bone lesion or focal pathologic process. Soft tissues and spinal canal: No prevertebral fluid or swelling. No visible canal hematoma. Disc levels: Normal multilevel endplates are seen with normal multilevel intervertebral disc spaces. Normal multilevel bilateral facet joints are noted. Upper chest: Negative. Other: It should be noted that the study is limited secondary to patient motion. IMPRESSION: No evidence of acute fracture or subluxation of the cervical spine. Electronically Signed   By: Aram Candela M.D.   On: 01/11/2020 16:25    ____________________________________________   PROCEDURES  Procedure(s) performed:   Procedures  None  ____________________________________________   INITIAL IMPRESSION / ASSESSMENT AND PLAN / ED COURSE  Pertinent labs & imaging results that were available during my care of the patient were reviewed by me and considered in my medical  decision making (see chart for details).   Patient presents emergency department for evaluation after witnessed seizure.  Patient has prior presentations to the ED with alcohol intoxication.  He reports drinking only on the weekends and not cutting back significantly.  He also tells me that he has had seizures in the past but does not appear to be on antiepileptic drugs.  Plan for CT imaging of his head and C-spine along with screening lab work.  EKG shows sinus tachycardia. Question EtOH withdrawal.   04:45 PM  CT imaging and labs reviewed.  Alcohol level 76.  Patient states he did drink alcohol last night  but not today.  He has an anion gap of 21. Will send VBG and give additional IVF. No infection symptoms and lactate may be elevated 2/2 seizure. No indication to suspect sepsis at this time. C-collar removed. CT head with no acute findings. Tachycardia slightly improved here. Will continue to follow.   06:45 PM  Patient with CIWA of 7.  Continued mild tachycardia and tremors on exam few subjective symptoms of EtOH withdrawal. VBG normal.  09:10 PM  Patient feeling and looking much better.  Vital signs have normalized. He is not interested in stopping drinking.  I did prescribe a Librium taper in case he changes his mind.  Discussed that stopping alcohol suddenly and without assistance can cause seizures and could be dangerous. Since he had a seizure today I encouraged admit for EtOH detox and withdrawal mgmt but does not want to stay. Discussed strict ED return precautions. Placed referral to neurology with his report of multiple seizures in the past and his insistence that he does not drink daily.  He does not drive a car.  He was specifically cautioned both verbally and in writing do not drive.  ____________________________________________  FINAL CLINICAL IMPRESSION(S) / ED DIAGNOSES  Final diagnoses:  Seizure-like activity (HCC)     MEDICATIONS GIVEN DURING THIS VISIT:  Medications  LORazepam (ATIVAN) injection 0-4 mg ( Intravenous See Alternative 01/11/20 1945)    Or  LORazepam (ATIVAN) tablet 0-4 mg (1 mg Oral Given 01/11/20 1945)  LORazepam (ATIVAN) injection 0-4 mg (has no administration in time range)    Or  LORazepam (ATIVAN) tablet 0-4 mg (has no administration in time range)  sodium chloride 0.9 % bolus 1,000 mL (0 mLs Intravenous Stopped 01/11/20 1634)  thiamine (B-1) injection 100 mg (100 mg Intravenous Given 01/11/20 1532)  LORazepam (ATIVAN) injection 1 mg (1 mg Intravenous Given 01/11/20 1533)  sodium chloride 0.9 % bolus 1,000 mL (0 mLs Intravenous Stopped 01/11/20 1823)      NEW OUTPATIENT MEDICATIONS STARTED DURING THIS VISIT:  New Prescriptions   CHLORDIAZEPOXIDE (LIBRIUM) 25 MG CAPSULE    50mg  PO TID x 1D, then 25-50mg  PO BID X 1D, then 25-50mg  PO QD X 1D    Note:  This document was prepared using Dragon voice recognition software and may include unintentional dictation errors.  , MD, [facility]    Long, [facility], MD 01/11/20 2114

## 2020-01-12 ENCOUNTER — Emergency Department (HOSPITAL_COMMUNITY)
Admission: EM | Admit: 2020-01-12 | Discharge: 2020-01-12 | Disposition: A | Discharge status: Home / Self Care | Payer: Self-pay | Attending: Emergency Medicine | Admitting: Emergency Medicine

## 2020-01-12 ENCOUNTER — Encounter (HOSPITAL_COMMUNITY): Payer: Self-pay | Admitting: *Deleted

## 2020-01-12 DIAGNOSIS — L299 Pruritus, unspecified: Secondary | ICD-10-CM | POA: Insufficient documentation

## 2020-01-12 MED ORDER — DIPHENHYDRAMINE HCL 25 MG PO TABS: 25.0000 mg | ORAL_TABLET | Freq: Four times a day (QID) | ORAL | 0 refills | Status: AC | PRN

## 2020-01-12 NOTE — ED Triage Notes (Addendum)
Interpreter [phone] used. To ED via [facility] for eval of itching all over body. States this started yesterday. No new meds, clothes, foods, furniture.

## 2020-01-12 NOTE — ED Notes (Signed)
Patient states he started itching last pm, states he was in the ED the other day for abd. Pain however wasn't given any new medication. Denies sob or difficulty swallowing.

## 2020-01-12 NOTE — Discharge Instructions (Signed)
Take Benadryl 25mg  every 6 hours as needed for rash and itching Avoid hot showers Apply unscented lotion such as Aveeno, Dove, or Eucerin for dry skin

## 2020-01-12 NOTE — ED Provider Notes (Signed)
[facility]MENT Provider Note   CSN: [id number] Arrival date & time: 01/12/20  1000     History Chief Complaint  Patient presents with  . Rash    [facility] is a 24 y.o. male who presents with itching and a rash. Pt arrives by EMS. He states it started last night after he left the ED for seizure like activity. He was prescribed Librium but hasn't taken it yet. He states he has a rash all over his body. No fever, SOB, wheezing, abdominal pain, N/V. No allergies or new soaps, lotions, detergents.   HPI     Past Medical History:  Diagnosis Date  . Known health problems: none     Patient Active Problem List   Diagnosis Date Noted  . Thrombocytopenia ([place]) 12/20/2018  . Elevated blood pressure reading 12/20/2018  . Abnormal liver enzymes 12/20/2018    Past Surgical History:  Procedure Laterality Date  . NO PAST SURGERIES         Family History  Problem Relation Age of Onset  . Hypertension Neg Hx   . Diabetes Neg Hx   . Cancer Neg Hx   . Heart disease Neg Hx     Social History   Tobacco Use  . Smoking status: Never Smoker  . Smokeless tobacco: Never Used  Substance Use Topics  . Alcohol use: Yes  . Drug use: No    Home Medications Prior to Admission medications   Medication Sig Start Date End Date Taking? Authorizing Provider  chlordiazePOXIDE (LIBRIUM) 25 MG capsule 50mg  PO TID x 1D, then 25-50mg  PO BID X 1D, then 25-50mg  PO QD X 1D 01/11/20   Long, Wonda Olds, MD  famotidine (PEPCID) 20 MG tablet Take 1 tablet (20 mg total) by mouth 2 (two) times daily. 07/27/19   Law, Bea Graff, PA-C  sucralfate (CARAFATE) 1 GM/10ML suspension Take 10 mLs (1 g total) by mouth 4 (four) times daily -  with meals and at bedtime. 07/27/19   Frederica Kuster, PA-C    Allergies    Patient has no known allergies.  Review of Systems   Review of Systems  Respiratory: Negative for shortness of breath.   Gastrointestinal: Negative for  abdominal pain.  Skin: Positive for rash.       +itching    Physical Exam Updated Vital Signs BP (!) 170/51   Pulse (!) 105   Temp 98.5 F (36.9 C) (Oral)   Resp 18   SpO2 100%   Physical Exam Vitals and nursing note reviewed.  Constitutional:      General: He is not in acute distress.    Appearance: Normal appearance. He is well-developed. He is not ill-appearing.  HENT:     Head: Normocephalic and atraumatic.  Eyes:     General: No scleral icterus.       Right eye: No discharge.        Left eye: No discharge.     Conjunctiva/sclera: Conjunctivae normal.     Pupils: Pupils are equal, round, and reactive to light.  Cardiovascular:     Rate and Rhythm: Normal rate.  Pulmonary:     Effort: Pulmonary effort is normal. No respiratory distress.  Abdominal:     General: There is no distension.  Musculoskeletal:     Cervical back: Normal range of motion.  Skin:    General: Skin is warm and dry.     Comments: Dry skin. No visible rash  Neurological:  Mental Status: He is alert and oriented to person, place, and time.  Psychiatric:        Behavior: Behavior normal.     ED Results / Procedures / Treatments   Labs (all labs ordered are listed, but only abnormal results are displayed) Labs Reviewed - No data to display  EKG None  Radiology CT Head Wo Contrast  Result Date: 01/11/2020 CLINICAL DATA:  Possible seizure. EXAM: CT HEAD WITHOUT CONTRAST TECHNIQUE: Contiguous axial images were obtained from the base of the skull through the vertex without intravenous contrast. COMPARISON:  None. FINDINGS: Brain: There is mild cerebral atrophy with widening of the extra-axial spaces and ventricular dilatation. There are areas of decreased attenuation within the white matter tracts of the supratentorial brain, consistent with microvascular disease changes. Vascular: No hyperdense vessel or unexpected calcification. Skull: Normal. Negative for fracture or focal lesion.  Sinuses/Orbits: No acute finding. Other: Mild scalp soft tissue swelling is seen along the posterior aspect of the vertex on the left. IMPRESSION: 1. Mild scalp soft tissue swelling along the posterior aspect of the vertex on the left. 2. No acute intracranial abnormality. Electronically Signed   By: Aram Candela M.D.   On: 01/11/2020 16:23   CT Cervical Spine Wo Contrast  Result Date: 01/11/2020 CLINICAL DATA:  Found laying in the middle of the road. EXAM: CT CERVICAL SPINE WITHOUT CONTRAST TECHNIQUE: Multidetector CT imaging of the cervical spine was performed without intravenous contrast. Multiplanar CT image reconstructions were also generated. COMPARISON:  None. FINDINGS: Alignment: Normal. Skull base and vertebrae: No acute fracture. No primary bone lesion or focal pathologic process. Soft tissues and spinal canal: No prevertebral fluid or swelling. No visible canal hematoma. Disc levels: Normal multilevel endplates are seen with normal multilevel intervertebral disc spaces. Normal multilevel bilateral facet joints are noted. Upper chest: Negative. Other: It should be noted that the study is limited secondary to patient motion. IMPRESSION: No evidence of acute fracture or subluxation of the cervical spine. Electronically Signed   By: Aram Candela M.D.   On: 01/11/2020 16:25    Procedures Procedures (including critical care time)  Medications Ordered in ED Medications - No data to display  ED Course  I have reviewed the triage vital signs and the nursing notes.  Pertinent labs & imaging results that were available during my care of the patient were reviewed by me and considered in my medical decision making (see chart for details).  24 year old male presents with diffuse body itching and subjective rash.  Blood pressure is elevated here but otherwise vital signs are reassuring.  He has occasional itching on exam but I do not observe a rash.  No signs of allergic reaction. Skin does  appear dry.  Will prescribe Benadryl and advised avoid hot showers and try a thick hydrating lotion such as Aveeno or Eucerin.  MDM Rules/Calculators/A&P                       Final Clinical Impression(s) / ED Diagnoses Final diagnoses:  Itching    Rx / DC Orders ED Discharge Orders    None       [name], PA-C 01/12/20 1108    Arby Barrette, MD 01/23/20 [phone]

## 2020-07-03 ENCOUNTER — Emergency Department (HOSPITAL_COMMUNITY)
Admission: EM | Admit: 2020-07-03 | Discharge: 2020-07-03 | Disposition: A | Discharge status: Home / Self Care | Payer: Self-pay | Attending: Emergency Medicine | Admitting: Emergency Medicine

## 2020-07-03 ENCOUNTER — Encounter (HOSPITAL_COMMUNITY): Payer: Self-pay | Admitting: Emergency Medicine

## 2020-07-03 ENCOUNTER — Other Ambulatory Visit: Payer: Self-pay

## 2020-07-03 DIAGNOSIS — A64 Unspecified sexually transmitted disease: Secondary | ICD-10-CM | POA: Insufficient documentation

## 2020-07-03 DIAGNOSIS — Z113 Encounter for screening for infections with a predominantly sexual mode of transmission: Secondary | ICD-10-CM

## 2020-07-03 DIAGNOSIS — R369 Urethral discharge, unspecified: Secondary | ICD-10-CM | POA: Insufficient documentation

## 2020-07-03 DIAGNOSIS — Z114 Encounter for screening for human immunodeficiency virus [HIV]: Secondary | ICD-10-CM | POA: Insufficient documentation

## 2020-07-03 DIAGNOSIS — R300 Dysuria: Secondary | ICD-10-CM | POA: Insufficient documentation

## 2020-07-03 LAB — URINALYSIS, ROUTINE W REFLEX MICROSCOPIC
Bilirubin Urine: NEGATIVE
Glucose, UA: NEGATIVE mg/dL
Hgb urine dipstick: NEGATIVE
Ketones, ur: NEGATIVE mg/dL
Leukocytes,Ua: NEGATIVE
Nitrite: NEGATIVE
Protein, ur: NEGATIVE mg/dL
Specific Gravity, Urine: 1.004 — ABNORMAL LOW (ref 1.005–1.030)
pH: 5 (ref 5.0–8.0)

## 2020-07-03 LAB — HIV ANTIBODY (ROUTINE TESTING W REFLEX): HIV Screen 4th Generation wRfx: NONREACTIVE

## 2020-07-03 NOTE — ED Provider Notes (Signed)
[facility] EMERGENCY DEPARTMENT Provider Note   CSN: [id number] Arrival date & time: 07/03/20  1539     History Chief Complaint  Patient presents with  . SEXUALLY TRANSMITTED DISEASE    Ein Moser is a 24 y.o. male.  HPI Patient is 24 year old male presented today with concerns for sexually transmitted disease.  He initially told triage that he was having penile discharge however he does require Swahili interpreter and when I discussed his symptoms with him he states he is actually having no symptoms apart from a pinching sensation in his penis occasionally when he has pain.  He states that he had unprotected sex approximately 1 week ago.  He denies any penile discharge, penile pain, dysuria, frequency or urgency.  He has no known STD exposure.  He is requesting evaluation for STD.  He is not interested in being treated today.  But would prefer to be tested and call if he has any abnormal results.  Patient denies any other associated symptoms today.  Specifically denies any nausea vomiting diarrhea abdominal pain chest pain or shortness of breath.  He has taken no medications prior to arrival.    Past Medical History:  Diagnosis Date  . Known health problems: none     Patient Active Problem List   Diagnosis Date Noted  . Thrombocytopenia ([facility]) 12/20/2018  . Elevated blood pressure reading 12/20/2018  . Abnormal liver enzymes 12/20/2018    Past Surgical History:  Procedure Laterality Date  . NO PAST SURGERIES         Family History  Problem Relation Age of Onset  . Hypertension Neg Hx   . Diabetes Neg Hx   . Cancer Neg Hx   . Heart disease Neg Hx     Social History   Tobacco Use  . Smoking status: Never Smoker  . Smokeless tobacco: Never Used  Vaping Use  . Vaping Use: Never used  Substance Use Topics  . Alcohol use: Yes  . Drug use: No    Home Medications Prior to Admission medications   Medication Sig Start Date End Date  Taking? Authorizing Provider  chlordiazePOXIDE (LIBRIUM) 25 MG capsule 50mg  PO TID x 1D, then 25-50mg  PO BID X 1D, then 25-50mg  PO QD X 1D 01/11/20   Long, 01/13/20, MD  diphenhydrAMINE (BENADRYL) 25 MG tablet Take 1 tablet (25 mg total) by mouth every 6 (six) hours as needed. 01/12/20   01/14/20, PA-C  famotidine (PEPCID) 20 MG tablet Take 1 tablet (20 mg total) by mouth 2 (two) times daily. 07/27/19   Law, 14/2/20, PA-C  sucralfate (CARAFATE) 1 GM/10ML suspension Take 10 mLs (1 g total) by mouth 4 (four) times daily -  with meals and at bedtime. 07/27/19   14/2/20, PA-C    Allergies    Patient has no known allergies.  Review of Systems   Review of Systems  Constitutional: Negative for chills and fever.  Cardiovascular: Negative for chest pain.  Gastrointestinal: Negative for abdominal pain, nausea and vomiting.  Genitourinary: Negative for difficulty urinating, discharge, dysuria, flank pain, frequency, hematuria, penile pain and testicular pain.  Musculoskeletal: Negative for neck pain.    Physical Exam Updated Vital Signs BP (!) 136/100 (BP Location: Left Arm)   Pulse 78   Temp 98.9 F (37.2 C) (Oral)   Resp 16   Ht 5\' 7"  (1.702 m)   Wt 76 kg   SpO2 99%   BMI 26.24 kg/m  Physical Exam Vitals and nursing note reviewed.  Constitutional:      General: He is not in acute distress.    Appearance: Normal appearance. He is not ill-appearing.  HENT:     Head: Normocephalic and atraumatic.  Eyes:     General: No scleral icterus.       Right eye: No discharge.        Left eye: No discharge.     Conjunctiva/sclera: Conjunctivae normal.  Pulmonary:     Effort: Pulmonary effort is normal.     Breath sounds: No stridor.  Genitourinary:    Comments: Deferred-patient would prefer to be tested and is not requesting physical exam at this time Neurological:     Mental Status: He is alert and oriented to person, place, and time. Mental status is at baseline.      ED Results / Procedures / Treatments   Labs (all labs ordered are listed, but only abnormal results are displayed) Labs Reviewed  URINALYSIS, ROUTINE W REFLEX MICROSCOPIC - Abnormal; Notable for the following components:      Result Value   Specific Gravity, Urine 1.004 (*)    All other components within normal limits  RPR  HIV ANTIBODY (ROUTINE TESTING W REFLEX)  GC/CHLAMYDIA PROBE AMP ([place]) NOT AT [facility]    EKG None  Radiology No results found.  Procedures Procedures (including critical care time)  Medications Ordered in ED Medications - No data to display  ED Course  I have reviewed the triage vital signs and the nursing notes.  Pertinent labs & imaging results that were available during my care of the patient were reviewed by me and considered in my medical decision making (see chart for details).    MDM Rules/Calculators/A&P                          Patient is 24 year old male Swahili speaker. He is presented today for screening for STDs.  He has some concern for exposure given that he was sexually active with a male 1 week ago however he is denying any symptoms.  He states that he does occasionally have a pinching sensation in his penis when he pees however it is difficult to get him to explain how long this has been ongoing.  This does not appear to be a new problem for him.  Urinalysis obtained is without any signs of infection.  I have low suspicion for urethritis given his story and he is deferring physical exam.  He has no abdominal tenderness on my examination.  Will obtain GC chlamydia, RPR, HIV testing and ensure the patient's phone number is up-to-date in our system therefore he can be called for any abnormal labs.  He is agreeable to plan.  He will follow closely with his primary care doctor I also provided him with the  and wellness clinic information as well as the information for the [facility] of [employer].  Please have a shared decision-making a physician about whether to empirically treat and he is not interested in treatment at this time.  Final Clinical Impression(s) / ED Diagnoses Final diagnoses:  Screen for STD (sexually transmitted disease)    Rx / DC Orders ED Discharge Orders    None       Gailen Shelter, [place] 07/03/20 2143    Mancel Bale, MD 07/03/20 2303

## 2020-07-03 NOTE — ED Triage Notes (Signed)
Patient arrives to ED with complaints of dysuria and penile discharge x1 week. Had unprotected sex last week with unknown partner. Discharge white.

## 2020-07-03 NOTE — Discharge Instructions (Addendum)
Please follow-up with your primary care doctor.  Please make sure that your phone number is up-to-date in our system so we are able to call you with your gonorrhea or chlamydia test is positive. If you do not have a primary care doctor please follow-up with the  wellness clinic which I have provided you information for  I have also provided with information for the [facility] please follow-up with him for future STD screening.

## 2020-07-04 LAB — RPR: RPR Ser Ql: NONREACTIVE

## 2021-06-27 ENCOUNTER — Ambulatory Visit (HOSPITAL_COMMUNITY)
Admission: EM | Admit: 2021-06-27 | Discharge: 2021-06-27 | Disposition: A | Discharge status: Home / Self Care | Payer: No Payment, Other | Attending: Behavioral Health | Admitting: Behavioral Health

## 2021-06-27 ENCOUNTER — Inpatient Hospital Stay (HOSPITAL_COMMUNITY)
Admission: EM | Admit: 2021-06-27 | Discharge: 2021-07-04 | DRG: 896 | Disposition: A | Discharge status: Home / Self Care | Payer: Self-pay | Attending: Critical Care Medicine | Admitting: Critical Care Medicine

## 2021-06-27 DIAGNOSIS — R748 Abnormal levels of other serum enzymes: Secondary | ICD-10-CM | POA: Diagnosis present

## 2021-06-27 DIAGNOSIS — F23 Brief psychotic disorder: Secondary | ICD-10-CM | POA: Diagnosis not present

## 2021-06-27 DIAGNOSIS — R7401 Elevation of levels of liver transaminase levels: Secondary | ICD-10-CM | POA: Diagnosis present

## 2021-06-27 DIAGNOSIS — J101 Influenza due to other identified influenza virus with other respiratory manifestations: Secondary | ICD-10-CM | POA: Diagnosis present

## 2021-06-27 DIAGNOSIS — F29 Unspecified psychosis not due to a substance or known physiological condition: Secondary | ICD-10-CM

## 2021-06-27 DIAGNOSIS — E871 Hypo-osmolality and hyponatremia: Secondary | ICD-10-CM | POA: Diagnosis present

## 2021-06-27 DIAGNOSIS — F10231 Alcohol dependence with withdrawal delirium: Principal | ICD-10-CM | POA: Diagnosis present

## 2021-06-27 DIAGNOSIS — F10931 Alcohol use, unspecified with withdrawal delirium: Secondary | ICD-10-CM

## 2021-06-27 DIAGNOSIS — Z20822 Contact with and (suspected) exposure to covid-19: Secondary | ICD-10-CM | POA: Diagnosis present

## 2021-06-27 DIAGNOSIS — Z79899 Other long term (current) drug therapy: Secondary | ICD-10-CM

## 2021-06-27 DIAGNOSIS — E876 Hypokalemia: Secondary | ICD-10-CM | POA: Diagnosis present

## 2021-06-27 DIAGNOSIS — G40909 Epilepsy, unspecified, not intractable, without status epilepticus: Secondary | ICD-10-CM | POA: Diagnosis present

## 2021-06-27 DIAGNOSIS — F10939 Alcohol use, unspecified with withdrawal, unspecified: Secondary | ICD-10-CM | POA: Diagnosis present

## 2021-06-27 DIAGNOSIS — F1020 Alcohol dependence, uncomplicated: Secondary | ICD-10-CM | POA: Diagnosis present

## 2021-06-27 DIAGNOSIS — F22 Delusional disorders: Secondary | ICD-10-CM | POA: Diagnosis present

## 2021-06-27 DIAGNOSIS — R030 Elevated blood-pressure reading, without diagnosis of hypertension: Secondary | ICD-10-CM | POA: Diagnosis present

## 2021-06-27 DIAGNOSIS — F10950 Alcohol use, unspecified with alcohol-induced psychotic disorder with delusions: Secondary | ICD-10-CM | POA: Insufficient documentation

## 2021-06-27 DIAGNOSIS — Z87898 Personal history of other specified conditions: Secondary | ICD-10-CM

## 2021-06-27 DIAGNOSIS — Z781 Physical restraint status: Secondary | ICD-10-CM

## 2021-06-27 DIAGNOSIS — R443 Hallucinations, unspecified: Secondary | ICD-10-CM

## 2021-06-27 DIAGNOSIS — D696 Thrombocytopenia, unspecified: Secondary | ICD-10-CM | POA: Diagnosis present

## 2021-06-27 DIAGNOSIS — M6282 Rhabdomyolysis: Secondary | ICD-10-CM | POA: Diagnosis present

## 2021-06-27 DIAGNOSIS — Z8659 Personal history of other mental and behavioral disorders: Secondary | ICD-10-CM

## 2021-06-27 DIAGNOSIS — G928 Other toxic encephalopathy: Secondary | ICD-10-CM | POA: Diagnosis present

## 2021-06-27 LAB — RAPID URINE DRUG SCREEN, HOSP PERFORMED
Amphetamines: NOT DETECTED
Barbiturates: NOT DETECTED
Benzodiazepines: NOT DETECTED
Cocaine: NOT DETECTED
Opiates: NOT DETECTED
Tetrahydrocannabinol: NOT DETECTED

## 2021-06-27 LAB — CBC WITH DIFFERENTIAL/PLATELET
Abs Immature Granulocytes: 0.03 10*3/uL (ref 0.00–0.07)
Basophils Absolute: 0.1 10*3/uL (ref 0.0–0.1)
Basophils Relative: 1 %
Eosinophils Absolute: 0.1 10*3/uL (ref 0.0–0.5)
Eosinophils Relative: 1 %
HCT: 39.1 % (ref 39.0–52.0)
Hemoglobin: 13.9 g/dL (ref 13.0–17.0)
Immature Granulocytes: 0 %
Lymphocytes Relative: 10 %
Lymphs Abs: 0.8 10*3/uL (ref 0.7–4.0)
MCH: 32.3 pg (ref 26.0–34.0)
MCHC: 35.5 g/dL (ref 30.0–36.0)
MCV: 90.7 fL (ref 80.0–100.0)
Monocytes Absolute: 1.8 10*3/uL — ABNORMAL HIGH (ref 0.1–1.0)
Monocytes Relative: 23 %
Neutro Abs: 4.9 10*3/uL (ref 1.7–7.7)
Neutrophils Relative %: 65 %
Platelets: 72 10*3/uL — ABNORMAL LOW (ref 150–400)
RBC: 4.31 MIL/uL (ref 4.22–5.81)
RDW: 11.6 % (ref 11.5–15.5)
WBC: 7.6 10*3/uL (ref 4.0–10.5)
nRBC: 0 % (ref 0.0–0.2)

## 2021-06-27 LAB — COMPREHENSIVE METABOLIC PANEL
ALT: 177 U/L — ABNORMAL HIGH (ref 0–44)
AST: 1201 U/L — ABNORMAL HIGH (ref 15–41)
Albumin: 3.7 g/dL (ref 3.5–5.0)
Alkaline Phosphatase: 74 U/L (ref 38–126)
Anion gap: 13 (ref 5–15)
BUN: <5 mg/dL — ABNORMAL LOW (ref 6–20)
CO2: 21 mmol/L — ABNORMAL LOW (ref 22–32)
Calcium: 9.5 mg/dL (ref 8.9–10.3)
Chloride: 96 mmol/L — ABNORMAL LOW (ref 98–111)
Creatinine, Ser: 0.89 mg/dL (ref 0.61–1.24)
GFR, Estimated: >60 mL/min (ref >60)
Glucose, Bld: 111 mg/dL — ABNORMAL HIGH (ref 70–99)
Potassium: 4.5 mmol/L (ref 3.5–5.1)
Sodium: 130 mmol/L — ABNORMAL LOW (ref 135–145)
Total Bilirubin: 2.3 mg/dL — ABNORMAL HIGH (ref 0.3–1.2)
Total Protein: 8.4 g/dL — ABNORMAL HIGH (ref 6.5–8.1)

## 2021-06-27 LAB — ETHANOL: Alcohol, Ethyl (B): <10 mg/dL (ref <10)

## 2021-06-27 LAB — RESP PANEL BY RT-PCR (FLU A&B, COVID) ARPGX2
Influenza A by PCR: POSITIVE — AB
Influenza B by PCR: NEGATIVE
SARS Coronavirus 2 by RT PCR: NEGATIVE

## 2021-06-27 NOTE — BH Assessment (Signed)
Pt called 911 due to thinking his brother and their friend sent a monkey out of a game they were playing to try and steal his legs. Pt also reports the drawings on his sandals are alive. Pt reports drinking one four loco today and a Ice house beer yesterday. Pt denies drinking daily and drug use, denies prior mental health issues and denies SI, HI and AVH.   Pt is urgent

## 2021-06-27 NOTE — Progress Notes (Signed)
Pt called 911 due to thinking that something was trying to steal his legs per [employer]. TTS contacted patient's brother [name] [phone] who states that patient has been seeing things in the house that are not there, he stays up all night, he tries to leave the house at night and they are not sure where he is going.  Brother states that patient is drinking heavily almost daily and states that he has fallen seven times in the past week. He states that patient would be drinking daily if he had the resources, but he is currently unemployed. Brother also reports that patient has been having "seizures." He states that patient has never been treated for mental illness and states that he has never been on any mental health medications. He also reports that patient has never been suicidal or homicidal that he knows of.  Patient presents as being very disorganized.  Patient reports differently from his brother's report and states that he sleeps 4-6 hours per night.  He states that his appetite is good, but states that he is a vegetarian.  Patient states that he only drinks 1 beer four days a week which is contrary to his brother's reporting.  Due to the report of seizures, the provider at the [facility] felt like patient would benefit from a medical evaluation/clearance.

## 2021-06-27 NOTE — BH Assessment (Signed)
Comprehensive Clinical Assessment (CCA) Note  06/27/2021 Jason Carney [id number]  Disposition:  Per Jason Nixon, NP, Patient needs medical clearance   The patient demonstrates the following risk factors for suicide: Chronic risk factors for suicide include: psychiatric disorder of delusional thinking/psychosis . Acute risk factors for suicide include: unemployment. Protective factors for this patient include: positive social support. Considering these factors, the overall suicide risk at this point appears to be low. Patient is not appropriate for outpatient follow up due to his possible need for alcohol detoxification.   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 Family/Friend  What Is the Reason for Your Visit/Call Today? Pt called 911 due to thinking that something was trying to steal his legs per [employer]. TTS contacted patient's brother Jason Carney [phone] who states that patient has been seeing things in the house that are not there, he stays up all night, he tries to leave the house at night and they are not sure where he is going.  Brother states that patient is drinking heavily almost daily and states that he has fallen seven times in the past week. He states that patient would be drinking daily if he had the resources, but he is currently unemployed. Brother also reports that patient has been having "seizures." He states that patient has never  been treated for mental illness and states that he has never been on any mental health medications. He also reports that patient has never been suicidal or homicidal that he knows of.  Patient presents as being very disorganized.  Patient reports differently from his brother's report and states that he sleeps 4-6 hours per night.  He states that his appetite is good, but states that he is a vegetarian.  Patient states that he only drinks 1 beer four days a week which is contrary to his brother's reporting.  Due to the report of seizures, the provider at the [facility] felt like patient would benefit from a medical evaluation/clearance.  How Long Has This Been Causing You Problems? 1-6 months  What Do You Feel Would Help You the Most Today? Treatment for Depression or other mood problem   Have You Recently Had Any Thoughts About Hurting Yourself? No  Are You Planning to Commit Suicide/Harm Yourself At This time? No   Have you Recently Had Thoughts About Hurting Someone Jason Carney? No  Are You Planning to Harm Someone at This Time? No  Explanation: No data recorded  Have You Used Any Alcohol or Drugs in the Past 24 Hours? Yes  How Long Ago Did You Use Drugs or Alcohol? No data recorded What Did You Use and How Much? 1 four loco   Do You Currently Have a Therapist/Psychiatrist? No data recorded Name of Therapist/Psychiatrist: No data recorded  Have You Been Recently Discharged From Any Office Practice or Programs? No data recorded Explanation of Discharge From Practice/Program: No data recorded  CCA Screening Triage Referral Assessment Type of Contact: No data recorded Telemedicine Service Delivery:   Is this Initial or Reassessment? No data recorded Date Telepsych consult ordered in [facility]:  No data recorded Time Telepsych consult ordered in [facility]:  No data recorded Location of Assessment: No data recorded Provider Location: No data recorded  Collateral Involvement: No data recorded  Does  Patient Have a [place]? No data recorded Name and Contact of Legal Guardian: No data recorded If Minor and Not Living with Parent(s), Who has Custody? No data recorded Is CPS involved or ever been involved? No data recorded Is APS involved or ever been involved? No data recorded  Patient Determined To Be At Risk for Harm To Self or Others Based on Review of Patient Reported Information or Presenting Complaint? No data recorded Method: No data recorded Availability of Means: No data recorded Intent: No data recorded Notification Required: No data recorded Additional Information for Danger to Others Potential: No data recorded Additional Comments for Danger to Others Potential: No data recorded Are There Guns or Other Weapons in Your Home? No data recorded Types of Guns/Weapons: No data recorded Are These Weapons Safely Secured?                            No data recorded Who Could Verify You Are Able To Have These Secured: No data recorded Do You Have any Outstanding Charges, Pending Court Dates, Parole/Probation? No data recorded Contacted To Inform of Risk of Harm To Self or Others: No data recorded   Does Patient Present under Involuntary Commitment? No data recorded IVC Papers Initial File Date: No data recorded  [place] of Residence: No data recorded  Patient Currently Receiving the Following Services: No data recorded  Determination of Need: Urgent (48 hours)   Options For Referral: Inpatient Hospitalization; Medication Management; Outpatient Therapy     CCA Biopsychosocial Patient Reported Schizophrenia/Schizoaffective Diagnosis in Past: No   Strengths: none reported   Mental Health Symptoms Depression:   Change in energy/activity   Duration of Depressive symptoms:    Mania:   None   Anxiety:    None   Psychosis:   Delusions; Hallucinations   Duration of Psychotic symptoms:  Duration of Psychotic Symptoms: Less than six months    Trauma:   None   Obsessions:   None   Compulsions:   None   Inattention:   None   Hyperactivity/Impulsivity:   None   Oppositional/Defiant Behaviors:   None   Emotional Irregularity:   None   Other Mood/Personality Symptoms:   patient is disorganized    Mental Status Exam Appearance and self-care  Stature:   Small   Weight:   Thin   Clothing:   Casual   Grooming:   Normal   Cosmetic use:   Age appropriate   Posture/gait:   Normal   Motor activity:   Not Remarkable   Sensorium  Attention:   Normal   Concentration:   Scattered   Orientation:   Object; Person; Place; Time   Recall/memory:   Normal; Defective in Immediate; Defective in Short-term   Affect and Mood  Affect:   Blunted   Mood:   Other (Comment) (appropriate to circumstances)   Relating  Eye contact:   Normal   Facial expression:   Responsive   Attitude toward examiner:   Cooperative   Thought and Language  Speech flow:  Clear and Coherent  Thought content:   Ideas of Reference; Delusions   Preoccupation:   None   Hallucinations:   Visual   Organization:  No data recorded  [employer] of Knowledge:   Average   Intelligence:   Average   Abstraction:   Normal   Judgement:   Impaired   Reality Testing:   Distorted   Insight:   Lacking   Decision Making:   Impulsive   Social Functioning  Social Maturity:   Impulsive   Social Judgement:   Normal   Stress  Stressors:   Teacher, music Ability:   Programme researcher, broadcasting/film/video Deficits:   Decision making   Supports:   Family     Religion: Religion/Spirituality Are You A Religious Person?: No How Might This Affect Treatment?: N/A  Leisure/Recreation: Leisure / Recreation Do You Have Hobbies?: No  Exercise/Diet: Exercise/Diet Do You Exercise?: No Have You Gained or Lost A Significant Amount of Weight in the Past Six Months?: No Do You Follow a Special Diet?:  No Do You Have Any Trouble Sleeping?: No   CCA Employment/Education Employment/Work Situation: Employment / Work Situation Employment Situation: Unemployed Patient's Job has Been Impacted by Current Illness: Yes Describe how Patient's Job has Been Impacted: unable to maintain employment Has Patient ever Been in the [employer]?: No  Education: Education Is Patient Currently Attending School?: No Last Grade Completed:  (unknown) Did You Nutritional therapist?:  (not assessed) Did You Have An Individualized Education Program (IIEP): No Did You Have Any Difficulty At School?: No Patient's Education Has Been Impacted by Current Illness: No   CCA Family/Childhood History Family and Relationship History: Family history Does patient have children?: No  Childhood History:  Childhood History By whom was/is the patient raised?: Mother Did patient suffer any verbal/emotional/physical/sexual abuse as a child?: No Did patient suffer from severe childhood neglect?: No Has patient ever been sexually abused/assaulted/raped as an adolescent or adult?: No Was the patient ever a victim of a crime or a disaster?: No Witnessed domestic violence?: No Has patient been affected by domestic violence as an adult?: No  Child/Adolescent Assessment:     CCA Substance Use Alcohol/Drug Use: Alcohol / Drug Use Pain Medications: see MAR Prescriptions: see MAR Over the Counter: see MAR History of alcohol / drug use?: Yes Longest period of sobriety (when/how long): none reported Negative Consequences of Use: Personal relationships, Museum/gallery curator, Work / Youth worker Withdrawal Symptoms: Seizures Onset of Seizures: 1 week ago Date of most recent seizure: this week Substance #1 Name of Substance 1: alcohol 1 - Age of First Use: unknown 1 - Amount (size/oz): 1 four loco according to patient 1 - Frequency: 4 x week 1 - Duration: unknown 1 - Last Use / Amount: 1 four loco 1 - Method of Aquiring: store 1- Route of  Use: oral                       ASAM's:  Six Dimensions of Multidimensional Assessment  Dimension 1:  Acute Intoxication and/or Withdrawal Potential:   Dimension 1:  Description of individual's past and current experiences of substance use and withdrawal: Patient has alcohol withdawal symptoms  Dimension 2:  Biomedical Conditions and Complications:   Dimension 2:  Description of patient's biomedical conditions and  complications: Patient has no medical conditions  Dimension 3:  Emotional, Behavioral, or Cognitive Conditions and Complications:  Dimension 3:  Description of emotional, behavioral, or cognitive conditions and complications: Patient is delusional and  disorganized  Dimension 4:  Readiness to Change:  Dimension 4:  Description of Readiness to Change criteria: Patient does not report a desire to stop drinking  Dimension 5:  Relapse, Continued use, or Continued Problem Potential:  Dimension 5:  Relapse, continued use, or continued problem potential critiera description: Patient lacks coping strategies to prevent relapse  Dimension 6:  Recovery/Living Environment:  Dimension 6:  Recovery/Iiving environment criteria description: Patient lives in a safe and supportive environment  ASAM Severity Score: ASAM's Severity Rating Score: 12  ASAM Recommended Level of Treatment:     Substance use Disorder (SUD) Substance Use Disorder (SUD)  Checklist Symptoms of Substance Use: Continued use despite having a persistent/recurrent physical/psychological problem caused/exacerbated by use, Continued use despite persistent or recurrent social, interpersonal problems, caused or exacerbated by use, Evidence of withdrawal (Comment), Large amounts of time spent to obtain, use or recover from the substance(s), Persistent desire or unsuccessful efforts to cut down or control use, Recurrent use that results in a failure to fulfill major role obligations (work, school, home), Social, occupational,  recreational activities given up or reduced due to use  Recommendations for Services/Supports/Treatments: Recommendations for Services/Supports/Treatments Recommendations For Services/Supports/Treatments:  (medical clearance)  Discharge Disposition:    DSM5 Diagnoses: Patient Active Problem List   Diagnosis Date Noted   Alcohol use disorder, severe, dependence ([place])    Alcohol-induced psychosis, with delusions ([place])    Thrombocytopenia ([place]) 12/20/2018   Elevated blood pressure reading 12/20/2018   Abnormal liver enzymes 12/20/2018     Referrals to Alternative Service(s): Referred to Alternative Service(s):   Place:   Date:   Time:    Referred to Alternative Service(s):   Place:   Date:   Time:    Referred to Alternative Service(s):   Place:   Date:   Time:    Referred to Alternative Service(s):   Place:   Date:   Time:     Leighla Chestnutt J Tiffiny Worthy, LCAS

## 2021-06-27 NOTE — ED Triage Notes (Signed)
Pt here via [employer], IVC'd by brother due to hallucinations, pt carrying knife & saying that people were trying to grab/hurt him, hearing things. Pt abuses alcohol daily, hx seizures. Interpreter used in triage, pt calm/ cooperative w staff.

## 2021-06-27 NOTE — ED Provider Notes (Signed)
Behavioral Health Urgent Care Medical Screening Exam  Patient Name: Jason Carney MRN: [id number] Date of Evaluation: 06/27/21 Diagnosis:  Final diagnoses:  Brief psychotic disorder (HCC)    History of Present illness: Jason Carney is a 25 y.o. male patient who presents to the [facility] voluntarily accompanied by law enforcement with a chief complaint of calling 911 due to thinking his brother and their friend sent a monkey out of a game they were playing to try and steal his legs. Pt also reports the drawings on his sandals are alive. Pt reports drinking one four loco today and a Ice house beer yesterday.   Patient seen and evaluated face-to-face by this provider, chart reviewed and case discussed with Dr. Lucianne Muss. On evaluation patient is alert and partially oriented to self. His thought process is disorganized with paranoia and delusional thought content. His speech is tangential. Patient is a poor historian and provides limited history.   He reports drinking a four loco at home and states that there was a car outside of his house. He states his brother friend sent a monkey out of a game and they were trying to steal his legs. He denies hearing voices or seeing things that other people cannot hear or see. He appears to be responding to external stimuli AEB stating, "look a my foot, you see it on there." He believes someone is trying to steal his leg. He denies thoughts of wanting to hurt himself or others. He reports sleeping 4 or 5 hours. He reports he is a vegetarian and his appetite is good. He reports drinking beer since age 29. He reports drinking 4 or 5 days out of the week. He is unable to quantify how much he drinks. He denies alcohol withdrawal symptoms at this time and a past hx of alcohol withdrawal or seizures/DTs. He reports his last drink was this morning. He denies using illicit drugs. He reports that he resides with his brother and mother. He is  employed. He denies hx of seizures or HTN.  Collateral obtained from the pt's brother Towana Badger via telephone who states that the pt started falling this week, starting on Monday. He states that the pt has been seeing things that aren't there and trying to leave the house at night. He states that the pt drinks heavily and will drink a box of gin. He states that the pt has no prior mental health hx. He states that the pt was hospitalized last year for two to three days. He is unable to recall why Mar[place] was hospitalized last year.   Per chart review, pt was seen in 2020 for seizure related activity.      Psychiatric Specialty Exam  Presentation  General Appearance:Disheveled  Eye Contact:Minimal  Speech:Normal Rate  Speech Volume:Normal  Handedness:No data recorded  Mood and Affect  Mood:Euthymic  Affect:Blunt   Thought Process  Thought Processes:Disorganized; Irrevelant  Descriptions of Associations:Tangential  Orientation:Partial  Thought Content:Delusions; Paranoid Ideation  Diagnosis of Schizophrenia or Schizoaffective disorder in past: No   Hallucinations:No data recorded Ideas of Reference:Paranoia; Delusions  Suicidal Thoughts:No  Homicidal Thoughts:No   Sensorium  Memory:Immediate Poor; Recent Poor; Remote Poor  Judgment:Impaired  Insight:None   Executive Functions  Concentration:Poor  Attention Span:Poor  Recall:Poor  Fund of Knowledge:Poor  Language:Other (comment) (speaks Swahili)   Psychomotor Activity  Psychomotor Activity:Normal   Assets  Assets:Housing   Sleep  Sleep:Poor  Number of hours: No data recorded  No data recorded  Physical Exam: Physical Exam Cardiovascular:     Rate and Rhythm: Tachycardia present.  Pulmonary:     Effort: Pulmonary effort is normal.  Neurological:     Mental Status: He is alert.   Review of Systems  Constitutional: Negative.   HENT: Negative.    Eyes: Negative.   Respiratory:  Negative.    Cardiovascular: Negative.   Gastrointestinal: Negative.   Genitourinary: Negative.   Musculoskeletal: Negative.   Skin:        Abrasion to right side of face and right hand   Neurological: Negative.   Endo/Heme/Allergies: Negative.   Blood pressure (!) 161/106, pulse (!) 114, temperature 99.9 F (37.7 C), resp. rate 18, SpO2 98 %. There is no height or weight on file to calculate BMI.  Musculoskeletal: Strength & Muscle Tone: within normal limits Gait & Station: normal Patient leans: N/A   [facility] Discharge Disposition for Follow up and Recommendations: Based on my evaluation the patient appears to have an emergency medical condition for which I recommend the patient be transferred to the emergency department for further evaluation.  Patient sent to [facility] for medical clearance. Report given to Dr. Jeraldine Loots. Patient is voluntary. Pt is hypertensive. Pt has prior hx of seizure like activity. Family reports multiple falls this week.   Layla Barter, NP 06/27/2021, 12:23 PM

## 2021-06-28 ENCOUNTER — Emergency Department (HOSPITAL_COMMUNITY): Payer: Self-pay

## 2021-06-28 LAB — HEPATITIS PANEL, ACUTE
HCV Ab: NONREACTIVE
Hep A IgM: NONREACTIVE
Hep B C IgM: NONREACTIVE
Hepatitis B Surface Ag: NONREACTIVE

## 2021-06-28 MED ORDER — THIAMINE HCL 100 MG/ML IJ SOLN: 100.0000 mg | Freq: Every day | INTRAMUSCULAR | Status: DC

## 2021-06-28 MED ORDER — SODIUM CHLORIDE 0.9 % IV BOLUS
1000.0000 mL | Freq: Once | INTRAVENOUS | Status: AC
  Administered 2021-06-28: 1000 mL via INTRAVENOUS

## 2021-06-28 MED ORDER — OLANZAPINE 5 MG PO TBDP
5.0000 mg | ORAL_TABLET | Freq: Two times a day (BID) | ORAL | Status: DC
  Administered 2021-06-28 – 2021-07-04 (×12): 5 mg via ORAL
  Filled 2021-06-28 (×14): qty 1

## 2021-06-28 MED ORDER — LORAZEPAM 2 MG/ML IJ SOLN
0.0000 mg | Freq: Four times a day (QID) | INTRAMUSCULAR | Status: AC
  Administered 2021-06-28 – 2021-06-29 (×3): 2 mg via INTRAVENOUS
  Filled 2021-06-28 (×3): qty 1

## 2021-06-28 MED ORDER — LORAZEPAM 1 MG PO TABS
0.0000 mg | ORAL_TABLET | Freq: Four times a day (QID) | ORAL | Status: AC
  Administered 2021-06-28 (×2): 1 mg via ORAL
  Filled 2021-06-28 (×2): qty 1

## 2021-06-28 MED ORDER — ZIPRASIDONE MESYLATE 20 MG IM SOLR
20.0000 mg | Freq: Once | INTRAMUSCULAR | Status: AC
  Administered 2021-06-28: 20 mg via INTRAMUSCULAR
  Filled 2021-06-28: qty 20

## 2021-06-28 MED ORDER — IOHEXOL 300 MG/ML  SOLN
100.0000 mL | Freq: Once | INTRAMUSCULAR | Status: AC | PRN
  Administered 2021-06-28: 100 mL via INTRAVENOUS

## 2021-06-28 MED ORDER — LORAZEPAM 2 MG/ML IJ SOLN
0.0000 mg | Freq: Two times a day (BID) | INTRAMUSCULAR | Status: DC
  Administered 2021-06-30 – 2021-07-01 (×3): 2 mg via INTRAVENOUS
  Filled 2021-06-28 (×2): qty 1
  Filled 2021-06-28: qty 2

## 2021-06-28 MED ORDER — STERILE WATER FOR INJECTION IJ SOLN
INTRAMUSCULAR | Status: AC
  Administered 2021-06-28: 1.5 mL
  Filled 2021-06-28: qty 10

## 2021-06-28 MED ORDER — THIAMINE HCL 100 MG PO TABS
100.0000 mg | ORAL_TABLET | Freq: Every day | ORAL | Status: DC
  Administered 2021-06-28 – 2021-07-01 (×4): 100 mg via ORAL
  Filled 2021-06-28 (×4): qty 1

## 2021-06-28 MED ORDER — LORAZEPAM 1 MG PO TABS: 0.0000 mg | ORAL_TABLET | Freq: Two times a day (BID) | ORAL | Status: DC

## 2021-06-28 MED ORDER — ACETAMINOPHEN 500 MG PO TABS
1000.0000 mg | ORAL_TABLET | Freq: Once | ORAL | Status: AC
  Administered 2021-06-28: 1000 mg via ORAL
  Filled 2021-06-28: qty 2

## 2021-06-28 NOTE — ED Notes (Signed)
Alex brother [phone] requesting to speak to the patient

## 2021-06-28 NOTE — ED Notes (Signed)
Pt belongings placed in cabinet 12 in purple zone. Black tshirt and black jeans in belongings bag. Unable to find any shoes. Valuables (wallet and phone) given to security. RN aware.

## 2021-06-28 NOTE — ED Notes (Signed)
Pt provided drink and turkey sandwich at this time 

## 2021-06-28 NOTE — ED Notes (Signed)
Dinner ordered 

## 2021-06-28 NOTE — ED Notes (Signed)
Pt having a flight of ideas and increasingly becoming agitated, hallucinating, and disrupting other patients.

## 2021-06-28 NOTE — ED Provider Notes (Signed)
[facility] Emergency Department Provider Note MRN:  [id number]  Arrival date & time: 06/28/21     Chief Complaint   IVC   History of Present Illness   Jason Carney is a 25 y.o. year-old male with no pertinent past medical history presenting to the ED with chief complaint of IVC.  Patient exhibiting paranoid and delusional thoughts today, per documentation he felt that his brother and a monkey were coming to steal his legs.  He was also experiencing hallucinations, seeing pictures or objects moving that were not really moving.  Was evaluated by behavioral health urgent care and sent here for further care.  Currently has no complaints.  I was unable to obtain an accurate HPI, PMH, or ROS due to the patient's unstable psychiatric condition.  Level 5 caveat.  Review of Systems  Positive for hallucinations, delusional thoughts.  Patient's Health History    Past Medical History:  Diagnosis Date   Known health problems: none     Past Surgical History:  Procedure Laterality Date   NO PAST SURGERIES      Family History  Problem Relation Age of Onset   Hypertension Neg Hx    Diabetes Neg Hx    Cancer Neg Hx    Heart disease Neg Hx     Social History   Socioeconomic History   Marital status: Single    Spouse name: Not on file   Number of children: Not on file   Years of education: Not on file   Highest education level: Not on file  Occupational History   Not on file  Tobacco Use   Smoking status: Never   Smokeless tobacco: Never  Vaping Use   Vaping Use: Never used  Substance and Sexual Activity   Alcohol use: Yes   Drug use: No   Sexual activity: Not on file  Other Topics Concern   Not on file  Social History Narrative   Not on file   Social Determinants of Health   Financial Resource Strain: Not on file  Food Insecurity: Not on file  Transportation Needs: Not on file  Physical Activity: Not on file  Stress: Not on file  Social  Connections: Not on file  Intimate Partner Violence: Not on file     Physical Exam   Vitals:   06/27/21 1947 06/28/21 0342  BP: (!) 150/123 (!) 160/123  Pulse: (!) 135 99  Resp: 16 16  Temp: (!) 100.8 F (38.2 C) 99 F (37.2 C)  SpO2: 99% 99%    CONSTITUTIONAL: Well-appearing, NAD NEURO:  Alert and oriented x 3, no focal deficits EYES:  eyes equal and reactive ENT/NECK:  no LAD, no JVD CARDIO: Regular rate, well-perfused, normal S1 and S2 PULM:  CTAB no wheezing or rhonchi GI/GU:  normal bowel sounds, non-distended, non-tender MSK/SPINE:  No gross deformities, no edema SKIN:  no rash, atraumatic PSYCH: Tangential speech and mildly odd behavior  *Additional and/or pertinent findings included in MDM below  Diagnostic and Interventional Summary    EKG Interpretation  Date/Time:  Thursday June 27 2021 19:49:49 EDT Ventricular Rate:  140 PR Interval:  116 QRS Duration: 84 QT Interval:  296 QTC Calculation: 451 R Axis:   50 Text Interpretation: Sinus tachycardia Otherwise normal ECG Confirmed by Gerlene Fee [phone]) on 06/28/2021 2:08:37 AM       Labs Reviewed  RESP PANEL BY RT-PCR (FLU A&B, COVID) ARPGX2 - Abnormal; Notable for the following components:      Result  Value   Influenza A by PCR POSITIVE (*)    All other components within normal limits  COMPREHENSIVE METABOLIC PANEL - Abnormal; Notable for the following components:   Sodium 130 (*)    Chloride 96 (*)    CO2 21 (*)    Glucose, Bld 111 (*)    BUN <5 (*)    Total Protein 8.4 (*)    AST 1,201 (*)    ALT 177 (*)    Total Bilirubin 2.3 (*)    All other components within normal limits  CBC WITH DIFFERENTIAL/PLATELET - Abnormal; Notable for the following components:   Platelets 72 (*)    Monocytes Absolute 1.8 (*)    All other components within normal limits  ETHANOL  RAPID URINE DRUG SCREEN, HOSP PERFORMED  HEPATITIS PANEL, ACUTE    CT ABDOMEN PELVIS W CONTRAST  Final Result       Medications  sodium chloride 0.9 % bolus 1,000 mL (0 mLs Intravenous Stopped 06/28/21 0459)  acetaminophen (TYLENOL) tablet 1,000 mg (1,000 mg Oral Given 06/28/21 0339)  iohexol (OMNIPAQUE) 300 MG/ML solution 100 mL (100 mLs Intravenous Contrast Given 06/28/21 0400)     Procedures  /  Critical Care Procedures  ED Course and Medical Decision Making  I have reviewed the triage vital signs, the nursing notes, and pertinent available records from the EMR.  Listed above are laboratory and imaging tests that I personally ordered, reviewed, and interpreted and then considered in my medical decision making (see below for details).  Patient presenting with delusional/paranoid thoughts, here under IVC.  Also presenting with fever and tachycardia but no complaints, very well-appearing, well-perfused.  He has no bodily complaints but he also seems to be acutely psychotic.  His respiratory swab has come back positive for influenza A, which is the most likely source of this fever and tachycardia.  However, he also has a significantly elevated AST over 1000.  Suspect related to alcohol use given his history of such.  However will send hepatitis panel, obtain CT imaging to exclude intra-abdominal source of infection, provide IV fluids given the hyponatremia and likely dehydration.  If these remaining tests come back reassuring, we will have medical clearance and will await TTS recommendations     Vital signs have normalized, CT scan is without acute process.  Suspect AST elevated due to alcohol, patient is now medically cleared, awaiting TTS recommendations, signed out to default provider.  Elmer Sow. Pilar Plate, MD [facility] Emergency Medicine [facility] [email or web address]  Final Clinical Impressions(s) / ED Diagnoses     ICD-10-CM   1. Transaminitis  R74.01     2. Psychosis, unspecified psychosis type (HCC)  F29     3. Influenza A  J10.1       ED Discharge Orders     None         Discharge Instructions Discussed with and Provided to Patient:   Discharge Instructions   None       Sabas Sous, MD 06/28/21 [phone]

## 2021-06-29 ENCOUNTER — Observation Stay (HOSPITAL_COMMUNITY): Payer: Self-pay

## 2021-06-29 ENCOUNTER — Emergency Department (HOSPITAL_COMMUNITY): Payer: Self-pay

## 2021-06-29 DIAGNOSIS — F10939 Alcohol use, unspecified with withdrawal, unspecified: Secondary | ICD-10-CM | POA: Diagnosis present

## 2021-06-29 DIAGNOSIS — Z87898 Personal history of other specified conditions: Secondary | ICD-10-CM

## 2021-06-29 DIAGNOSIS — Z8659 Personal history of other mental and behavioral disorders: Secondary | ICD-10-CM

## 2021-06-29 LAB — PROTIME-INR
INR: 1.1 (ref 0.8–1.2)
Prothrombin Time: 13.9 seconds (ref 11.4–15.2)

## 2021-06-29 LAB — COMPREHENSIVE METABOLIC PANEL
ALT: 106 U/L — ABNORMAL HIGH (ref 0–44)
AST: 476 U/L — ABNORMAL HIGH (ref 15–41)
Albumin: 3.1 g/dL — ABNORMAL LOW (ref 3.5–5.0)
Alkaline Phosphatase: 56 U/L (ref 38–126)
Anion gap: 11 (ref 5–15)
BUN: 5 mg/dL — ABNORMAL LOW (ref 6–20)
CO2: 22 mmol/L (ref 22–32)
Calcium: 9.2 mg/dL (ref 8.9–10.3)
Chloride: 102 mmol/L (ref 98–111)
Creatinine, Ser: 0.69 mg/dL (ref 0.61–1.24)
GFR, Estimated: >60 mL/min (ref >60)
Glucose, Bld: 99 mg/dL (ref 70–99)
Potassium: 4.5 mmol/L (ref 3.5–5.1)
Sodium: 135 mmol/L (ref 135–145)
Total Bilirubin: 1.8 mg/dL — ABNORMAL HIGH (ref 0.3–1.2)
Total Protein: 7.2 g/dL (ref 6.5–8.1)

## 2021-06-29 LAB — LACTIC ACID, PLASMA: Lactic Acid, Venous: 1.7 mmol/L (ref 0.5–1.9)

## 2021-06-29 LAB — PHOSPHORUS: Phosphorus: 3.6 mg/dL (ref 2.5–4.6)

## 2021-06-29 LAB — AMMONIA: Ammonia: 60 umol/L — ABNORMAL HIGH (ref 9–35)

## 2021-06-29 LAB — CK: Total CK: 8229 U/L — ABNORMAL HIGH (ref 49–397)

## 2021-06-29 LAB — MAGNESIUM: Magnesium: 1.6 mg/dL — ABNORMAL LOW (ref 1.7–2.4)

## 2021-06-29 MED ORDER — LORAZEPAM 2 MG/ML IJ SOLN: 1.0000 mg | Freq: Once | INTRAMUSCULAR | Status: DC

## 2021-06-29 MED ORDER — SODIUM CHLORIDE 0.9% FLUSH
3.0000 mL | Freq: Two times a day (BID) | INTRAVENOUS | Status: DC
  Administered 2021-06-30 – 2021-07-04 (×8): 3 mL via INTRAVENOUS

## 2021-06-29 MED ORDER — ADULT MULTIVITAMIN W/MINERALS CH
1.0000 | ORAL_TABLET | Freq: Every day | ORAL | Status: DC
  Administered 2021-06-30 – 2021-07-01 (×2): 1 via ORAL
  Filled 2021-06-29 (×2): qty 1

## 2021-06-29 MED ORDER — LORAZEPAM 1 MG PO TABS
1.0000 mg | ORAL_TABLET | Freq: Once | ORAL | Status: AC
  Administered 2021-06-29: 1 mg via ORAL
  Filled 2021-06-29: qty 1

## 2021-06-29 MED ORDER — LORAZEPAM 2 MG/ML IJ SOLN
2.0000 mg | Freq: Once | INTRAMUSCULAR | Status: AC
  Administered 2021-06-29: 2 mg via INTRAMUSCULAR
  Filled 2021-06-29: qty 1

## 2021-06-29 MED ORDER — ZIPRASIDONE MESYLATE 20 MG IM SOLR: 10.0000 mg | Freq: Once | INTRAMUSCULAR | Status: DC

## 2021-06-29 MED ORDER — ACETAMINOPHEN 650 MG RE SUPP: 650.0000 mg | Freq: Four times a day (QID) | RECTAL | Status: DC | PRN

## 2021-06-29 MED ORDER — ACETAMINOPHEN 325 MG PO TABS
650.0000 mg | ORAL_TABLET | Freq: Four times a day (QID) | ORAL | Status: DC | PRN
  Administered 2021-06-29 – 2021-06-30 (×2): 650 mg via ORAL
  Filled 2021-06-29 (×2): qty 2

## 2021-06-29 MED ORDER — ZIPRASIDONE MESYLATE 20 MG IM SOLR
INTRAMUSCULAR | Status: AC
  Administered 2021-06-29: 20 mg
  Filled 2021-06-29: qty 20

## 2021-06-29 MED ORDER — LORAZEPAM 2 MG/ML IJ SOLN: 2.0000 mg | Freq: Once | INTRAMUSCULAR | Status: DC

## 2021-06-29 MED ORDER — LORAZEPAM 2 MG/ML IJ SOLN
1.0000 mg | Freq: Once | INTRAMUSCULAR | Status: AC
  Administered 2021-07-01: 1 mg via INTRAVENOUS
  Filled 2021-06-29: qty 1

## 2021-06-29 MED ORDER — POLYETHYLENE GLYCOL 3350 17 G PO PACK: 17.0000 g | PACK | Freq: Every day | ORAL | Status: DC | PRN

## 2021-06-29 MED ORDER — FOLIC ACID 1 MG PO TABS
1.0000 mg | ORAL_TABLET | Freq: Every day | ORAL | Status: DC
  Administered 2021-06-30 – 2021-07-04 (×5): 1 mg via ORAL
  Filled 2021-06-29 (×5): qty 1

## 2021-06-29 NOTE — ED Provider Notes (Signed)
  Physical Exam  BP (!) 138/94 (BP Location: Right Arm)   Pulse 91   Temp 98.4 F (36.9 C) (Oral)   Resp (!) 22   SpO2 100%   Physical Exam  ED Course/Procedures     .Critical Care Performed by: Alvira Monday, MD Authorized by: Alvira Monday, MD   Critical care provider statement:    Critical care time (minutes):  75   Critical care was necessary to treat or prevent imminent or life-threatening deterioration of the following conditions:  Metabolic crisis  MDM    Received care of patient from previous providers.  Please see prior notes for history, physical and care.  I was notified that he was agitated and trying to leave.  At time of my evaluation, he is agitated, diaphoretic, repeating to yell mama, attempting to get out of bed.  Staff tells me that when they use a kiSwahili interpreter, however when they spoke to him in [place] he would speak in [place].  I called the family to discuss his care using a Swahili interpreter. They report he has no prior psychiatric history, does have a significant history of etoh use, frequent use, they did not know of prior withdrawal seizures, but report he had seizure on Monday and fell down. They think his last drink was last weekend. Thursday, he began to have visual hallucinations and they brought him to behavioral health at which point he was transferred to the ED and evaluated and medically cleared.   His family reports to me he has no history of psychiatric illness, and I am concerned that his visual hallucinations are of more likely an organic cause, with most likely etiology given history of etoh abuse, tachycardia, agitation, diaphoresis, hallucinations, being alcohol withdrawal---however differential also including other etiologies given fever on arrival to ED on 11/3, significant transaminitis with possible hepatic encephalopathy, meningitis, influenza related encephalitis.     Given his agitation and attempting to leave, concern  for possible withdrawal, he was given 2mg  ativan IMx2 and 20mg  of geodon.  A CT head was completed without abnormalities, MRI WWO contrast ordered however he began moving and was unable to be completed.  At this time of him being afebrile since arrival 2 days ago, symptoms since Thursday, I do not feel he needs emergent LP for bacterial meningitis.  I invited family to bedside, discussed via Swahili interpreter that he will need medical admission and further medical work up.  At this time, he is having tremors vs chills, stating he is cold, stating he is hungry, otherwise altered not acting himself.  His CIWA was calculated and 15.  While ddx continues to be wide with elevated temperature (which may be related to withdrawal, influenza A diagnosis) and MRI WWO pending, feel withdrawal and influenza most likely and that he requires medical admission.  At this time a medical cause of his hallucinations appears to be most likely.  Discussed with hospitalist and will admit, MRI WWO, ammonia, CK all pending at time of transfer of care to hospitalist.          , MD 06/30/21 [phone]

## 2021-06-29 NOTE — ED Notes (Signed)
Pt awake and eating breakfast Calm and cooperative, NAD

## 2021-06-29 NOTE — H&P (Signed)
History and Physical   Jason Carney [id number] DOB: 25-May-1996 DOA: 06/27/2021  PCP: Pcp, No   Patient coming from: Home/ED psych hold  Chief Complaint: Concern for alcohol withdrawal  HPI: Jason Carney is a 25 y.o. male with medical history significant of significant alcohol use, thrombocytopenia, LFT elevation, seizure presenting with altered mental status and hallucinations.  History obtained with assistance of chart review given patient's significant agitation and mental status change in addition to language barrier.  Patient reported he has significant alcohol use family states is much more than he is ever admitted to.  There was a previous evaluation in the ED in May where he he appeared to be starting to withdrawal however he declined admission at that time.  Family thinks his last drink was over the weekend or earlier this week sometime.  He could have hallucinations Per chart review he reportedly had a fall and a seizure on Monday.  And he has been having hallucinations starting Thursday where he thought people were trying to hurt him or cut him.  He went to behavioral health for evaluation and they sent to the ED for medical clearance.  There he did have his significant LFT elevation and flu positive with some significant agitation initially being treated for psychosis with consideration of possible alcohol withdrawal (CIWA with ativan in ED)  Unable to perform full review of systems due to patient's agitation  ED Course: Vital signs in the ED significant for persistent tachycardia in the 100s to 120s for the last couple of days.  Blood pressure ranging from the 120s to 150s systolic.  Otherwise stable.  Did initially have a fever when he came in but this has improved.  Lab work-up initially showed sodium 130, chloride 96, bicarb 21, AST 1200, ALT 177, T bili 2.3.  CBC with platelets 72.  Repeat labs are pending.  Lactic acid normal.  Flu a was positive in the ED.  UDS was negative  and ethanol level negative.  Hepatitis panel was performed while in the ED and this was negative.  Chest x-ray and CT head showed no acute normality.  MR brain has been ordered.  PT and INR of also been negative.  Patient received several doses of Ativan as well as a dose of Zyprexa which is now been scheduled and dose of thiamine.  Now with increasing concern for alcohol withdrawal, admission has been requested to support him through potential withdrawal and to rule this out.  Case had been discussed with neurology who noted that DTs can last for multiple days.  Review of Systems: I to be performed at this time.  As per HPI otherwise all other systems reviewed and are negative.  Past Medical History:  Diagnosis Date   Known health problems: none     Past Surgical History:  Procedure Laterality Date   NO PAST SURGERIES      Social History  reports that he has never smoked. He has never used smokeless tobacco. He reports current alcohol use. He reports that he does not use drugs.  No Known Allergies  Family History  Problem Relation Age of Onset   Hypertension Neg Hx    Diabetes Neg Hx    Cancer Neg Hx    Heart disease Neg Hx   Reviewed on admission  Prior to Admission medications   Medication Sig Start Date End Date Taking? Authorizing Provider  acetaminophen (TYLENOL) 500 MG tablet Take 1,000 mg by mouth every 6 (six) hours as needed for  moderate pain or headache.   Yes [provider]  chlordiazePOXIDE (LIBRIUM) 25 MG capsule 50mg  PO TID x 1D, then 25-50mg  PO BID X 1D, then 25-50mg  PO QD X 1D Patient not taking: Reported on 06/29/2021 01/11/20   Long, Wonda Olds, MD  diphenhydrAMINE (BENADRYL) 25 MG tablet Take 1 tablet (25 mg total) by mouth every 6 (six) hours as needed. Patient not taking: Reported on 06/29/2021 01/12/20   Recardo Evangelist, PA-C  famotidine (PEPCID) 20 MG tablet Take 1 tablet (20 mg total) by mouth 2 (two) times daily. Patient not taking: Reported on  06/29/2021 07/27/19   Frederica Kuster, PA-C  sucralfate (CARAFATE) 1 GM/10ML suspension Take 10 mLs (1 g total) by mouth 4 (four) times daily -  with meals and at bedtime. Patient not taking: Reported on 06/29/2021 07/27/19   Frederica Kuster, PA-C    Physical Exam: Vitals:   06/29/21 1845 06/29/21 1900 06/29/21 2110 06/29/21 2111  BP: (!) 123/95 134/90 (!) 154/106 (!) 154/106  Pulse: (!) 109 (!) 109 (!) 121 (!) 121  Resp: 13 14    Temp: 97.9 F (36.6 C) 98.2 F (36.8 C) 98.6 F (37 C)   TempSrc: Axillary Axillary Oral   SpO2: 98% 99% 95%    Physical Exam Constitutional:      General: He is not in acute distress.    Appearance: Normal appearance.     Comments: Tremulous, not responding to provider  HENT:     Head: Normocephalic and atraumatic.     Mouth/Throat:     Mouth: Mucous membranes are moist.     Pharynx: Oropharynx is clear.  Eyes:     Extraocular Movements: Extraocular movements intact.     Pupils: Pupils are equal, round, and reactive to light.  Cardiovascular:     Rate and Rhythm: Regular rhythm. Tachycardia present.     Pulses: Normal pulses.     Heart sounds: Normal heart sounds.  Pulmonary:     Effort: Pulmonary effort is normal. No respiratory distress.     Breath sounds: Normal breath sounds.  Abdominal:     General: Bowel sounds are normal. There is no distension.     Palpations: Abdomen is soft.     Tenderness: There is no abdominal tenderness.  Musculoskeletal:        General: No swelling or deformity.  Skin:    General: Skin is warm and dry.  Neurological:     General: No focal deficit present.     Comments: Not responding to provider currently   Labs on Admission: I have personally reviewed following labs and imaging studies  CBC: Recent Labs  Lab 06/27/21 2005  WBC 7.6  NEUTROABS 4.9  HGB 13.9  HCT 39.1  MCV 90.7  PLT 72*    Basic Metabolic Panel: Recent Labs  Lab 06/27/21 2005  NA 130*  K 4.5  CL 96*  CO2 21*  GLUCOSE 111*   BUN <5*  CREATININE 0.89  CALCIUM 9.5    GFR: CrCl cannot be calculated (Unknown ideal weight.).  Liver Function Tests: Recent Labs  Lab 06/27/21 2005  AST 1,201*  ALT 177*  ALKPHOS 74  BILITOT 2.3*  PROT 8.4*  ALBUMIN 3.7    Urine analysis:    Component Value Date/Time   COLORURINE YELLOW 07/03/2020 1940   APPEARANCEUR CLEAR 07/03/2020 1940   LABSPEC 1.004 (L) 07/03/2020 1940   PHURINE 5.0 07/03/2020 1940   GLUCOSEU NEGATIVE 07/03/2020 1940   HGBUR NEGATIVE 07/03/2020  [place] 07/03/2020 [place] NEGATIVE 07/03/2020 1940   PROTEINUR NEGATIVE 07/03/2020 1940   NITRITE NEGATIVE 07/03/2020 1940   LEUKOCYTESUR NEGATIVE 07/03/2020 1940    Radiological Exams on Admission: CT HEAD WO CONTRAST (5MM)  Result Date: 06/29/2021 CLINICAL DATA:  Delirium. EXAM: CT HEAD WITHOUT CONTRAST TECHNIQUE: Contiguous axial images were obtained from the base of the skull through the vertex without intravenous contrast. COMPARISON:  01/11/2020 FINDINGS: Brain: Mild atrophy, advanced for age. no evidence of acute infarction, hemorrhage, hydrocephalus, extra-axial collection or mass lesion/mass effect. Vascular: No hyperdense vessel or unexpected calcification. Skull: Normal. Negative for fracture or focal lesion. Sinuses/Orbits: No acute finding. Other: None. IMPRESSION: 1. No acute intracranial abnormality. 2. Mild atrophy, advanced for age. Electronically Signed   By: Keith Rake M.D.   On: 06/29/2021 19:55   CT ABDOMEN PELVIS W CONTRAST  Result Date: 06/28/2021 CLINICAL DATA:  25 year old male with abdominal pain. EXAM: CT ABDOMEN AND PELVIS WITH CONTRAST TECHNIQUE: Multidetector CT imaging of the abdomen and pelvis was performed using the standard protocol following bolus administration of intravenous contrast. CONTRAST:  118mL OMNIPAQUE IOHEXOL 300 MG/ML  SOLN COMPARISON:  None. FINDINGS: Lower chest: Negative. Hepatobiliary: Pronounced hepatic steatosis.   Negative gallbladder. Pancreas: Negative. Spleen: Negative. Adrenals/Urinary Tract: Normal adrenal glands. Kidneys appears symmetric and normal. No hydronephrosis or pararenal inflammation. Negative ureters and bladder. No urinary calculus. Stomach/Bowel: Redundant but frequently decompressed large bowel. Gas-filled redundant sigmoid:, Gas-filled transverse segment. Decompressed cecum. Elongated but normal appendix on coronal image 38. Decompressed terminal ileum. No large bowel inflammation. No dilated small bowel. Decompressed stomach and duodenum. No free air, free fluid, or mesenteric inflammation. Vascular/Lymphatic: Major arterial structures in the abdomen and pelvis appear patent and normal. Portal venous system is patent. No lymphadenopathy. Reproductive: Negative. Other: No pelvic free fluid. Musculoskeletal: Negative. IMPRESSION: 1. Pronounced Hepatic Steatosis. 2. But no other acute or inflammatory process identified in the abdomen or pelvis. Normal appendix. Electronically Signed   By: Genevie Ann M.D.   On: 06/28/2021 04:12   DG Chest Portable 1 View  Result Date: 06/29/2021 CLINICAL DATA:  Evaluate for possible foreign body EXAM: PORTABLE CHEST 1 VIEW COMPARISON:  08/03/2015 FINDINGS: The heart size and mediastinal contours are within normal limits. Both lungs are clear. The visualized skeletal structures are unremarkable. IMPRESSION: No acute abnormality noted. Specifically no radiopaque foreign body is noted. Electronically Signed   By: Inez Catalina M.D.   On: 06/29/2021 20:16    EKG: Independently reviewed.  EKG from 11/3 with sinus tachycardia at 140 bpm.  Assessment/Plan Principal Problem:   Alcohol withdrawal (HCC) Active Problems:   Thrombocytopenia (HCC)   Elevated blood pressure reading   Abnormal liver enzymes   Alcohol use disorder, severe, dependence ([place])   History of seizure due to alcohol withdrawal  Psychosis Alcohol withdrawal > Patient presenting with hallucinations  with concern for psychosis alone versus alcohol withdrawal leading to psychosis > There has been some concern for this over the last couple days but has increased while he has been boarding in the ED > Case has been discussed with neurology who notes possible for severe withdrawal to go on for several days. > Does have significant alcohol use as detailed below.  Last drink was possibly at the end of last weekend. - Monitor in stepdown unit - CIWA with Ativan - Thiamine, folate, multivitamin - Psychiatric evaluation to help rule in and out alcohol induced psychosis versus psychosis independent of alcohol - Check  Magnesium and Phos - Continue scheduled zyprexa  Alcohol use Transaminitis Thrombocytopenia > Significant alcohol use.  He reports drinking alcohol 4-5 times a week per chart review but family has reportedly endorse significant drinking beyond this and he did have an ED evaluation in May where there is concern for him having the beginning of alcohol withdrawal with admission offered but declined at that time. > Significant transaminitis with AST 1200 ALT 177.  Negative hepatitis panel in the ED. > Also with thrombocytopenia to 77 presumed alcohol related - Treatment of suspect alcohol withdrawal as above - Continue with vitamin support as above - Trend LFTs and platelet count  Seizures > Reportedly had a seizure in the course of his presentation (reportedly occurred prior to presenting to the ED) > No consult note listed from neurology however I do see neuro hospitalist are on his treatment team. - This is likely withdrawal related if the seizure occurred, no antiepileptics have been ordered at this time.  No recurrent seizures. - Seizure precautions  DVT prophylaxis: SCDs  Code Status:   Full  Family Communication:  Brother, who speaks some english, updated by phone   Disposition Plan:   Patient is from:  Home  Anticipated DC to:  Pending clinical course, likely behavioral  health Hospital  Anticipated DC date:  1 to 5 days  Anticipated DC barriers: None  Consults called:  None, appears EDP has consulted neurology however there is no consult note as of yet.  Admission status:  Observation, progressive   Severity of Illness: The appropriate patient status for this patient is OBSERVATION. Observation status is judged to be reasonable and necessary in order to provide the required intensity of service to ensure the patient's safety. The patient's presenting symptoms, physical exam findings, and initial radiographic and laboratory data in the context of their medical condition is felt to place them at decreased risk for further clinical deterioration. Furthermore, it is anticipated that the patient will be medically stable for discharge from the hospital within 2 midnights of admission.     Synetta Fail MD [facility]  How to contact the [facility] Attending or Consulting provider 7A - 7P or covering provider during after hours 7P -7A, for this patient?   Check the care team in [facility] and look for a) attending/consulting [facility] provider listed and b) the [facility] team listed Log into [email or web address] and use [facility] universal password to access. If you do not have the password, please contact the hospital operator. Locate the [facility] provider you are looking for under [facility] and page to a number that you can be directly reached. If you still have difficulty reaching the provider, please page the [facility] (Director on Call) for the Hospitalists listed on amion for assistance.  06/29/2021, 10:32 PM

## 2021-06-29 NOTE — Progress Notes (Addendum)
Pt arrived to MRI for exam. Safety screened via prior imaging. Tech extender attempted to prepare pt for exam. Immediately, pt began moving into fetal position completely covering themselves in blankets not allowing for proper preparation. Pt continued to move around. Pt too unstable to get prepared for exam nor obtain imaging in current condition. Nurse at bedside throughout process. Pt sent back to ED.

## 2021-06-29 NOTE — ED Notes (Signed)
Pt taken to c-t  tolerated well did not become alert  we went straight from there to mri  stayed there for an hour  pt not lying still for the test after several attempts to get the pt to co-operate  they reported that they could not do the mri  back to the ed at 2010

## 2021-06-29 NOTE — ED Notes (Signed)
This RN and Aundra Millet, RN changed pt's linens and provided pt with new warmed blankets after re-drawing blood sample for CMP lab.

## 2021-06-29 NOTE — ED Notes (Signed)
This RN attempted to speak to pt with swahili interpreter, pt was not cooperative and would intermittently speak in english during the conversation. This RN also attempted a CIWA but was limited due to participation from pt.

## 2021-06-29 NOTE — ED Notes (Signed)
Dr Dalene Seltzer has called 2 family members  to come in they are allowed at  the bedside only because she requested it.. both family have thick accents  the pt is still sleeping from the med given  vital are good   there are

## 2021-06-29 NOTE — ED Notes (Signed)
Pt more agitated refusing to go nto the room  security called to standby  not answering questions asked

## 2021-06-29 NOTE — ED Notes (Signed)
Pt made second phone call to his sister

## 2021-06-29 NOTE — ED Notes (Signed)
Ptatempting to run security still at  the bedside.  Dr schlossmanconsulted meds and labs requested  pt eing held in the room at present

## 2021-06-29 NOTE — ED Notes (Signed)
Pt taken to [facility] to take a shower and change his scrubs

## 2021-06-29 NOTE — ED Notes (Signed)
The pt is getting more agitated fighting with security.  Dr Dalene Seltzer here to see  the pt fighting more and more  yelling loudly disrupting the other patients

## 2021-06-29 NOTE — ED Notes (Signed)
This RN paged Dr. Alinda Money per pt oral temp 103.3

## 2021-06-30 DIAGNOSIS — Z8659 Personal history of other mental and behavioral disorders: Secondary | ICD-10-CM

## 2021-06-30 DIAGNOSIS — M6282 Rhabdomyolysis: Secondary | ICD-10-CM

## 2021-06-30 DIAGNOSIS — R030 Elevated blood-pressure reading, without diagnosis of hypertension: Secondary | ICD-10-CM

## 2021-06-30 DIAGNOSIS — F1020 Alcohol dependence, uncomplicated: Secondary | ICD-10-CM

## 2021-06-30 DIAGNOSIS — R748 Abnormal levels of other serum enzymes: Secondary | ICD-10-CM

## 2021-06-30 DIAGNOSIS — D696 Thrombocytopenia, unspecified: Secondary | ICD-10-CM

## 2021-06-30 DIAGNOSIS — J101 Influenza due to other identified influenza virus with other respiratory manifestations: Secondary | ICD-10-CM

## 2021-06-30 DIAGNOSIS — Z87898 Personal history of other specified conditions: Secondary | ICD-10-CM

## 2021-06-30 LAB — CBC
HCT: 34.8 % — ABNORMAL LOW (ref 39.0–52.0)
HCT: 36.5 % — ABNORMAL LOW (ref 39.0–52.0)
Hemoglobin: 12.2 g/dL — ABNORMAL LOW (ref 13.0–17.0)
Hemoglobin: 12.6 g/dL — ABNORMAL LOW (ref 13.0–17.0)
MCH: 32.1 pg (ref 26.0–34.0)
MCH: 32.6 pg (ref 26.0–34.0)
MCHC: 34.5 g/dL (ref 30.0–36.0)
MCHC: 35.1 g/dL (ref 30.0–36.0)
MCV: 92.9 fL (ref 80.0–100.0)
MCV: 93 fL (ref 80.0–100.0)
Platelets: 55 10*3/uL — ABNORMAL LOW (ref 150–400)
Platelets: 58 10*3/uL — ABNORMAL LOW (ref 150–400)
RBC: 3.74 MIL/uL — ABNORMAL LOW (ref 4.22–5.81)
RBC: 3.93 MIL/uL — ABNORMAL LOW (ref 4.22–5.81)
RDW: 11.7 % (ref 11.5–15.5)
RDW: 11.8 % (ref 11.5–15.5)
WBC: 4.9 10*3/uL (ref 4.0–10.5)
WBC: 6.6 10*3/uL (ref 4.0–10.5)
nRBC: 0 % (ref 0.0–0.2)
nRBC: 0 % (ref 0.0–0.2)

## 2021-06-30 LAB — COMPREHENSIVE METABOLIC PANEL
ALT: 88 U/L — ABNORMAL HIGH (ref 0–44)
AST: 348 U/L — ABNORMAL HIGH (ref 15–41)
Albumin: 2.9 g/dL — ABNORMAL LOW (ref 3.5–5.0)
Alkaline Phosphatase: 51 U/L (ref 38–126)
Anion gap: 10 (ref 5–15)
BUN: 5 mg/dL — ABNORMAL LOW (ref 6–20)
CO2: 20 mmol/L — ABNORMAL LOW (ref 22–32)
Calcium: 8.8 mg/dL — ABNORMAL LOW (ref 8.9–10.3)
Chloride: 102 mmol/L (ref 98–111)
Creatinine, Ser: 0.68 mg/dL (ref 0.61–1.24)
GFR, Estimated: >60 mL/min (ref >60)
Glucose, Bld: 79 mg/dL (ref 70–99)
Potassium: 3.2 mmol/L — ABNORMAL LOW (ref 3.5–5.1)
Sodium: 132 mmol/L — ABNORMAL LOW (ref 135–145)
Total Bilirubin: 1.7 mg/dL — ABNORMAL HIGH (ref 0.3–1.2)
Total Protein: 6.9 g/dL (ref 6.5–8.1)

## 2021-06-30 MED ORDER — LORAZEPAM 2 MG/ML IJ SOLN
1.0000 mg | INTRAMUSCULAR | Status: DC | PRN
  Administered 2021-06-30 (×2): 1 mg via INTRAVENOUS
  Filled 2021-06-30 (×2): qty 1

## 2021-06-30 MED ORDER — SODIUM CHLORIDE 0.45 % IV SOLN: INTRAVENOUS | Status: DC

## 2021-06-30 MED ORDER — OSELTAMIVIR PHOSPHATE 75 MG PO CAPS
75.0000 mg | ORAL_CAPSULE | Freq: Two times a day (BID) | ORAL | Status: DC
  Administered 2021-06-30 – 2021-07-04 (×8): 75 mg via ORAL
  Filled 2021-06-30 (×9): qty 1

## 2021-06-30 MED ORDER — MAGNESIUM SULFATE 2 GM/50ML IV SOLN
2.0000 g | Freq: Once | INTRAVENOUS | Status: AC
  Administered 2021-06-30: 2 g via INTRAVENOUS
  Filled 2021-06-30: qty 50

## 2021-06-30 MED ORDER — LORAZEPAM 2 MG/ML IJ SOLN
1.0000 mg | INTRAMUSCULAR | Status: DC | PRN
  Administered 2021-07-01 (×2): 1 mg via INTRAVENOUS
  Filled 2021-06-30 (×2): qty 1

## 2021-06-30 MED ORDER — POTASSIUM CHLORIDE IN NACL 20-0.45 MEQ/L-% IV SOLN
INTRAVENOUS | Status: DC
  Administered 2021-07-01: 1000 mL via INTRAVENOUS
  Filled 2021-06-30 (×8): qty 1000

## 2021-06-30 MED ORDER — HALOPERIDOL LACTATE 5 MG/ML IJ SOLN
5.0000 mg | Freq: Once | INTRAMUSCULAR | Status: AC
  Administered 2021-06-30: 5 mg via INTRAVENOUS
  Filled 2021-06-30: qty 1

## 2021-06-30 NOTE — ED Notes (Signed)
The patient's sister would like an update [phone]

## 2021-06-30 NOTE — ED Notes (Signed)
Pt awake and eating breakfast, sitter remains at bedside.

## 2021-06-30 NOTE — Progress Notes (Signed)
[facility] night cross cover note:  I was contacted by RN requesting prn medication for agitation refractory to verbal redirection.  I subsequently ordered prn Ativan for this purpose.     Newton Pigg, DO Hospitalist

## 2021-06-30 NOTE — ED Notes (Addendum)
Pt repeatedly getting out of bed and wandering his room without staff presence. This RN has repeatedly re-directed pt to his bed and repeatedly instructed him to not get out of bed without assistance from staff. Pt verbally compliant but appears confused, repeatedly stating that he is looking for his phone despite this RN repeatedly explaining that his phone is currently placed with security. DO paged.

## 2021-06-30 NOTE — Progress Notes (Addendum)
Per medical team pt has been admitted to medical floor for medical observation due to substance abuse with draw symptoms and caution for alcoholic seizures. CSW will remove pt from [facility] shift report at this time due to medical team currently following pt.   Maryjean Ka, MSW, [facility] 06/30/2021 12:52 PM

## 2021-06-30 NOTE — ED Notes (Addendum)
Patient transported to MRI 

## 2021-06-30 NOTE — ED Notes (Signed)
Pt found to be at the edge of his bed, confused. Pt refused to lay back down in his bed, and he instead began walking around the room and feeling the walls. Became increasingly agitated with this RN and other staff members attempting to re-direct him to the bed. Security called to bedside to assist pt back into bed. Further attempts at reality re-orientation were made. This RN paged Dr. Arlean Hopping about pt's escalating demeanor and behavior despite 0302 1mg  IV ativan administration (see MAR).

## 2021-06-30 NOTE — ED Notes (Signed)
Pt still refusing to lay in his bed, but is sitting calmly in visitor chair in his room.

## 2021-06-30 NOTE — Progress Notes (Addendum)
PROGRESS NOTE  Jason Carney KDX:[id number] DOB: Apr 19, 1996 DOA: 06/27/2021 PCP: Pcp, No   LOS: 0 days   Brief narrative:  Jason Carney is a 25 y.o. male with medical history looking for significant alcohol use, thrombocytopenia, seizure disorder presented to hospital with altered mental status in hallucinations.  He had significant agitation on presentation as well.  Family thought that his drink was over the weekend and he continued to have hallucinations.  Patient was seen at behavioral health but was sent to the ED for medical clearance.  In the ED patient was tachycardic.  Labs showed mild hyponatremia with AST of 200 and ALT of 177 and bilirubin of 2.3.  Platelet was 72.  Patient tested positive for flu.  UDS and ethanol level was negative.  Hepatitis panel was negative as well.  Chest x-ray CT head was negative.  Patient received several doses of Ativan in the ED and since there was concern for alcohol withdrawal so the patient was then considered for admission to the hospital.  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  Case was discussed with neurology by the initial provider who recommended treatment for withdrawal since it could last for few days.  Continue CIWA protocol thiamine folic acid.  Psychiatry evaluation.  Continue Zyprexa from home.  Psychiatry assessment pending.  MRI of the brain was attempted which was truncated exam but no acute infarct was noted.   Alcohol use with elevated LFTs and thrombocytopenia. Patient has history of drinking 4-5 times a week.  Significant alcohol usage.  Had AST and ALT elevation significantly on admission has been trending down.  Negative  hepatitis panel.  Continue vitamins.  Rhabdomyolysis.  CK levels were elevated.  Start the patient on IV half-normal saline saline with KCl.  Check CK levels in a.m. strict intake and output monitoring  Hypokalemia.  We will replenish.  Check levels in a.m.  Influenza A.  We will add Tamiflu.   Seizures On presentation.  Likely secondary to withdrawal.  No antiepileptics.  No recurrent seizures.  Continue seizure precaution.    DVT prophylaxis: SCDs Start: 06/29/21 2157   Code Status: Full  Family Communication:  No one at bedside   Status is: Observation  The patient will require care spanning > 2 midnights and should be moved to inpatient because: Further work-up treatment of alcohol withdrawal   Consultants: Neurology-pending Psychiatry-pending.  Procedures: None  Anti-infectives:  None  Anti-infectives (From admission, onward)    None       Subjective: Today, patient was seen and examined at bedside.  Nursing staff reported that he was agitated and confused with increased scores.  Objective: Vitals:   06/30/21 0141 06/30/21 0653  BP: (!) 159/114 (!) 134/91  Pulse: (!) 125 (!) 102  Resp: 18 18  Temp: 98.2 F (36.8 C)   SpO2: 98% 99%    Intake/Output Summary (Last 24 hours) at 06/30/2021 0911 Last data filed at 06/29/2021 1312 Gross per 24 hour  Intake 237 ml  Output --  Net 237 ml   There were no vitals filed for this visit. There is no height or weight on file to calculate BMI.   Physical Exam: GENERAL: Patient is alert awake but disoriented and  confused.  Not in obvious distress. HENT: No scleral pallor or icterus. Pupils equally reactive to light. Oral mucosa is moist, erythema noted over the right facial area. NECK: is supple, no gross swelling noted. CHEST: Clear to auscultation. No crackles or wheezes.  Diminished breath sounds bilaterally. CVS: S1 and S2 heard, no murmur. Regular rate and rhythm.  ABDOMEN: Soft, non-tender, bowel sounds  are present. EXTREMITIES: No edema. CNS: Moves all extremities, confused disoriented SKIN: warm and dry without rashes.  Data Review: I have personally reviewed the following laboratory data and studies,  CBC: Recent Labs  Lab 06/27/21 2005 06/29/21 2340 06/30/21 0650  WBC 7.6 4.9 6.6  NEUTROABS 4.9  --   --   HGB 13.9 12.6* 12.2*  HCT 39.1 36.5* 34.8*  MCV 90.7 92.9 93.0  PLT 72* 55* 58*   Basic Metabolic Panel: Recent Labs  Lab 06/27/21 2005 06/29/21 2245 06/30/21 0650  NA 130* 135 132*  K 4.5 4.5 3.2*  CL 96* 102 102  CO2 21* 22 20*  GLUCOSE 111* 99 79  BUN <5* 5* 5*  CREATININE 0.89 0.69 0.68  CALCIUM 9.5 9.2 8.8*  MG  --  1.6*  --   PHOS  --  3.6  --    Liver Function Tests: Recent Labs  Lab 06/27/21 2005 06/29/21 2245 06/30/21 0650  AST 1,201* 476* 348*  ALT 177* 106* 88*  ALKPHOS 74 56 51  BILITOT 2.3* 1.8* 1.7*  PROT 8.4* 7.2 6.9  ALBUMIN 3.7 3.1* 2.9*   No results for input(s): LIPASE, AMYLASE in the last 168 hours. Recent Labs  Lab 06/29/21 2055  AMMONIA 60*   Cardiac Enzymes: Recent Labs  Lab 06/29/21 2245  CKTOTAL 8,229*   BNP (last 3 results) No results for input(s): BNP in the last 8760 hours.  ProBNP (last 3 results) No results for input(s): PROBNP in the last 8760 hours.  CBG: No results for input(s): GLUCAP in the last 168 hours. Recent Results (from the past 240 hour(s))  Resp Panel by RT-PCR (Flu A&B, Covid) Nasopharyngeal Swab     Status: Abnormal   Collection Time: 06/27/21  7:55 PM   Specimen: Nasopharyngeal Swab; Nasopharyngeal(NP) swabs in vial transport medium  Result Value Ref Range Status   SARS Coronavirus 2 by RT PCR NEGATIVE NEGATIVE Final    Comment: (NOTE) SARS-CoV-2 target nucleic acids are NOT DETECTED.  The SARS-CoV-2 RNA is generally detectable in upper respiratory specimens during the acute phase of infection. The lowest concentration of SARS-CoV-2 viral copies this assay can detect is 138  copies/mL. A negative result does not preclude SARS-Cov-2 infection and should not be used as the sole basis for treatment or other patient management decisions. A negative result may occur with  improper specimen collection/handling, submission of specimen other than nasopharyngeal swab, presence of viral mutation(s) within the areas targeted by this assay, and inadequate number of viral copies(<138 copies/mL). A negative result must be combined with clinical observations, patient history, and epidemiological information. The expected result is Negative.  Fact Sheet for Patients:  EntrepreneurPulse.com.au  Fact Sheet for Healthcare Providers:  IncredibleEmployment.be  This test is no t yet approved or cleared by the [place] FDA and  has been authorized for detection and/or diagnosis of SARS-CoV-2 by FDA under an Emergency Use Authorization (EUA). This EUA will remain  in effect (meaning this test can be used) for the duration of the COVID-19 declaration under Section 564(b)(1) of the Act, 21 U.S.C.section 360bbb-3(b)(1), unless the authorization  is terminated  or revoked sooner.       Influenza A by PCR POSITIVE (A) NEGATIVE Final   Influenza B by PCR NEGATIVE NEGATIVE Final    Comment: (NOTE) The Xpert Xpress SARS-CoV-2/FLU/RSV plus assay is intended as an aid in the diagnosis of influenza from Nasopharyngeal swab specimens and should not be used as a sole basis for treatment. Nasal washings and aspirates are unacceptable for Xpert Xpress SARS-CoV-2/FLU/RSV testing.  Fact Sheet for Patients: EntrepreneurPulse.com.au  Fact Sheet for Healthcare Providers: IncredibleEmployment.be  This test is not yet approved or cleared by the [place] FDA and has been authorized for detection and/or diagnosis of SARS-CoV-2 by FDA under an Emergency Use Authorization (EUA). This EUA will remain in effect  (meaning this test can be used) for the duration of the COVID-19 declaration under Section 564(b)(1) of the Act, 21 U.S.C. section 360bbb-3(b)(1), unless the authorization is terminated or revoked.  Performed at [facility], [place] [address], [place], [place] [address]      Studies: CT HEAD WO CONTRAST (5MM)  Result Date: 06/29/2021 CLINICAL DATA:  Delirium. EXAM: CT HEAD WITHOUT CONTRAST TECHNIQUE: Contiguous axial images were obtained from the base of the skull through the vertex without intravenous contrast. COMPARISON:  01/11/2020 FINDINGS: Brain: Mild atrophy, advanced for age. no evidence of acute infarction, hemorrhage, hydrocephalus, extra-axial collection or mass lesion/mass effect. Vascular: No hyperdense vessel or unexpected calcification. Skull: Normal. Negative for fracture or focal lesion. Sinuses/Orbits: No acute finding. Other: None. IMPRESSION: 1. No acute intracranial abnormality. 2. Mild atrophy, advanced for age. Electronically Signed   By: Keith Rake M.D.   On: 06/29/2021 19:55   MR BRAIN WO CONTRAST  Result Date: 06/30/2021 CLINICAL DATA:  Psychosis EXAM: MRI HEAD WITHOUT CONTRAST TECHNIQUE: Multiplanar, multiecho pulse sequences of the brain and surrounding structures were obtained without intravenous contrast. COMPARISON:  None. FINDINGS: Only diffusion-weighted imaging, axial T2-weighted imaging and sagittal T1-weighted imaging were performed. There is no acute infarct. No midline shift or other mass effect. Midline structures are normal. IMPRESSION: Truncated examination. No acute infarct. Electronically Signed   By: Ulyses Jarred M.D.   On: 06/30/2021 00:35   DG Chest Portable 1 View  Result Date: 06/29/2021 CLINICAL DATA:  Evaluate for possible foreign body EXAM: PORTABLE CHEST 1 VIEW COMPARISON:  08/03/2015 FINDINGS: The heart size and mediastinal contours are within normal limits. Both lungs are clear. The visualized skeletal structures are unremarkable.  IMPRESSION: No acute abnormality noted. Specifically no radiopaque foreign body is noted. Electronically Signed   By: Inez Catalina M.D.   On: 06/29/2021 20:16      Flora Lipps, MD  [facility] 06/30/2021  If 7PM-7AM, please contact night-coverage

## 2021-06-30 NOTE — Progress Notes (Signed)
[facility] night cross cover note:  Agitation refractory to verbal redirection and multiple doses of Ativan.  One-time dose of Haldol ordered.      Newton Pigg, DO Hospitalist

## 2021-06-30 NOTE — ED Notes (Signed)
Pt seen pulling medical equipment off the walls. After staff removed equipment from pt's room, pt began to raise his voice and argue with staff. Multiple staff members stood in the doorway as pt attempted to leave his room to follow aforementioned staff. Pt has become completely un-redirectable and was seen attempting to pull out his IV. Upon this RN using her arm to block the doorway, pt placed his hands on this RN's arm. This RN firmly and sternly commanded pt to remove his hands from this RN's arm, to which pt obliged but remained otherwise uncooperative and continued to attempt to leave his room. Security called back to bedside to assist pt back into bed. This RN spoke with Dr. Arlean Hopping on the phone for update on pt's escalating behavior. Per Dr. Arlean Hopping, plan to place pt in soft wrist restraints at this time for pt safety.

## 2021-07-01 ENCOUNTER — Telehealth (HOSPITAL_COMMUNITY): Payer: Self-pay

## 2021-07-01 DIAGNOSIS — R443 Hallucinations, unspecified: Secondary | ICD-10-CM

## 2021-07-01 DIAGNOSIS — F10939 Alcohol use, unspecified with withdrawal, unspecified: Secondary | ICD-10-CM

## 2021-07-01 DIAGNOSIS — F29 Unspecified psychosis not due to a substance or known physiological condition: Secondary | ICD-10-CM

## 2021-07-01 LAB — CBC
HCT: 36.7 % — ABNORMAL LOW (ref 39.0–52.0)
Hemoglobin: 12.2 g/dL — ABNORMAL LOW (ref 13.0–17.0)
MCH: 31.4 pg (ref 26.0–34.0)
MCHC: 33.2 g/dL (ref 30.0–36.0)
MCV: 94.3 fL (ref 80.0–100.0)
Platelets: 85 10*3/uL — ABNORMAL LOW (ref 150–400)
RBC: 3.89 MIL/uL — ABNORMAL LOW (ref 4.22–5.81)
RDW: 11.8 % (ref 11.5–15.5)
WBC: 8.3 10*3/uL (ref 4.0–10.5)
nRBC: 0 % (ref 0.0–0.2)

## 2021-07-01 LAB — COMPREHENSIVE METABOLIC PANEL
ALT: 69 U/L — ABNORMAL HIGH (ref 0–44)
AST: 200 U/L — ABNORMAL HIGH (ref 15–41)
Albumin: 2.8 g/dL — ABNORMAL LOW (ref 3.5–5.0)
Alkaline Phosphatase: 55 U/L (ref 38–126)
Anion gap: 9 (ref 5–15)
BUN: <5 mg/dL — ABNORMAL LOW (ref 6–20)
CO2: 21 mmol/L — ABNORMAL LOW (ref 22–32)
Calcium: 8.6 mg/dL — ABNORMAL LOW (ref 8.9–10.3)
Chloride: 103 mmol/L (ref 98–111)
Creatinine, Ser: 0.67 mg/dL (ref 0.61–1.24)
GFR, Estimated: >60 mL/min (ref >60)
Glucose, Bld: 98 mg/dL (ref 70–99)
Potassium: 3.9 mmol/L (ref 3.5–5.1)
Sodium: 133 mmol/L — ABNORMAL LOW (ref 135–145)
Total Bilirubin: 2 mg/dL — ABNORMAL HIGH (ref 0.3–1.2)
Total Protein: 6.9 g/dL (ref 6.5–8.1)

## 2021-07-01 LAB — CK: Total CK: 2314 U/L — ABNORMAL HIGH (ref 49–397)

## 2021-07-01 LAB — GLUCOSE, CAPILLARY
Glucose-Capillary: 80 mg/dL (ref 70–99)
Glucose-Capillary: 97 mg/dL (ref 70–99)

## 2021-07-01 LAB — PHOSPHORUS: Phosphorus: 3 mg/dL (ref 2.5–4.6)

## 2021-07-01 LAB — MAGNESIUM: Magnesium: 1.7 mg/dL (ref 1.7–2.4)

## 2021-07-01 MED ORDER — THIAMINE HCL 100 MG PO TABS: 100.0000 mg | ORAL_TABLET | Freq: Every day | ORAL | Status: DC

## 2021-07-01 MED ORDER — LORAZEPAM 1 MG PO TABS
1.0000 mg | ORAL_TABLET | ORAL | Status: DC | PRN
  Filled 2021-07-01: qty 4

## 2021-07-01 MED ORDER — LORAZEPAM 1 MG PO TABS: 1.0000 mg | ORAL_TABLET | ORAL | Status: DC | PRN

## 2021-07-01 MED ORDER — LORAZEPAM 2 MG/ML IJ SOLN
1.0000 mg | INTRAMUSCULAR | Status: DC | PRN
  Administered 2021-07-01: 4 mg via INTRAVENOUS
  Filled 2021-07-01: qty 2

## 2021-07-01 MED ORDER — THIAMINE HCL 100 MG/ML IJ SOLN
200.0000 mg | Freq: Three times a day (TID) | INTRAVENOUS | Status: DC
  Administered 2021-07-01 – 2021-07-04 (×10): 200 mg via INTRAVENOUS
  Filled 2021-07-01 (×14): qty 2

## 2021-07-01 MED ORDER — LORAZEPAM 2 MG/ML IJ SOLN: 0.0000 mg | Freq: Three times a day (TID) | INTRAMUSCULAR | Status: DC

## 2021-07-01 MED ORDER — HYDRALAZINE HCL 20 MG/ML IJ SOLN: 10.0000 mg | INTRAMUSCULAR | Status: DC | PRN

## 2021-07-01 MED ORDER — LORAZEPAM 2 MG/ML IJ SOLN
2.0000 mg | Freq: Once | INTRAMUSCULAR | Status: AC
  Administered 2021-07-01: 2 mg via INTRAVENOUS
  Filled 2021-07-01: qty 1

## 2021-07-01 MED ORDER — DEXMEDETOMIDINE HCL IN NACL 400 MCG/100ML IV SOLN
0.2000 ug/kg/h | INTRAVENOUS | Status: DC
  Administered 2021-07-01: 0.2 ug/kg/h via INTRAVENOUS
  Filled 2021-07-01: qty 100

## 2021-07-01 MED ORDER — LORAZEPAM 1 MG PO TABS
1.0000 mg | ORAL_TABLET | Freq: Three times a day (TID) | ORAL | Status: DC
Start: 2021-07-01 — End: 2021-07-01
  Administered 2021-07-01: 1 mg via ORAL
  Filled 2021-07-01: qty 1

## 2021-07-01 MED ORDER — SODIUM CHLORIDE 0.9 % IV SOLN: INTRAVENOUS | Status: DC | PRN

## 2021-07-01 MED ORDER — LORAZEPAM 2 MG/ML IJ SOLN: 1.0000 mg | INTRAMUSCULAR | Status: DC | PRN

## 2021-07-01 MED ORDER — CHLORHEXIDINE GLUCONATE CLOTH 2 % EX PADS
6.0000 | MEDICATED_PAD | Freq: Every day | CUTANEOUS | Status: DC
  Administered 2021-07-01 – 2021-07-03 (×4): 6 via TOPICAL

## 2021-07-01 MED ORDER — LORAZEPAM 2 MG/ML IJ SOLN: 0.0000 mg | INTRAMUSCULAR | Status: DC

## 2021-07-01 MED ORDER — LORAZEPAM 2 MG/ML IJ SOLN
1.0000 mg | Freq: Once | INTRAMUSCULAR | Status: AC
  Administered 2021-07-01: 1 mg via INTRAVENOUS

## 2021-07-01 NOTE — Progress Notes (Signed)
Contact MD @ 1325, Pt CIWA increasing, administered medications and applied ankle restraints, Pt still pulling and restless currently,MD was notified again and additional orders put in and carried out.    Kalman Jewels, RN 07/01/2021 2:03 PM

## 2021-07-01 NOTE — Progress Notes (Signed)
eLink Physician-Brief Progress Note Patient Name: Jason Carney DOB: 07/09/1996 MRN: [id number]   Date of Service  07/01/2021  HPI/Events of Note  Patient with elevated blood pressure.  eICU Interventions  PRN Hydralazine ordered.     Intervention Category Intermediate Interventions: Hypertension - evaluation and management  Migdalia Dk 07/01/2021, 11:48 PM

## 2021-07-01 NOTE — Progress Notes (Addendum)
Report has been called 2M14  Kalman Jewels, RN 07/01/2021 4:21 PM

## 2021-07-01 NOTE — Progress Notes (Signed)
Pt CIWA has increased >20, MD notified to come assess patient.   Kalman Jewels, RN 07/01/2021 3:26 PM

## 2021-07-01 NOTE — Consult Note (Signed)
NAMETag Jason Carney, MRN:  [id number], DOB:  1996-02-17, LOS: 1 ADMISSION DATE:  06/27/2021, CONSULTATION DATE:  11/7 REFERRING MD:  Tyson Babinski, CHIEF COMPLAINT:  agitated delirium    History of Present Illness:  25 year old male patient who was admitted on 11/3 after being brought in by the police department and placed on IVC after being found hallucinating, and carrying a knife, and saying people were trying to hurt him.  He was having audible hallucinations at that time as well.  His agitation continued to worsen while in the emergency room as a psychiatric hold , He apparently speaks Swahili.  He was eventually admitted on 11/5 and started on CIWA protocol, his agitation continued to the point of requiring four-point restraints and multiple repeated doses of IV lorazepam equaling 11 mg in a short 6-hour..  Because of escalating benzodiazepine use critical care was asked to evaluate for Precedex infusion on 11/7.  Pertinent  Medical History  Prior history of alcohol abuse, seizure disorder, thrombocytopenia.  Significant Hospital Events: Including procedures, antibiotic start and stop dates in addition to other pertinent events   11/5 admitted with hallucinations CIWA protocol started 11/7 critical care consulted for Precedex  Interim History / Subjective:  Currently sedated Objective   Blood pressure (Abnormal) 132/92, pulse 97, temperature 97.7 F (36.5 C), temperature source Oral, resp. rate (Abnormal) 23, SpO2 99 %.        Intake/Output Summary (Last 24 hours) at 07/01/2021 1621 Last data filed at 07/01/2021 1500 Gross per 24 hour  Intake 2213.07 ml  Output 500 ml  Net 1713.07 ml   There were no vitals filed for this visit.  Examination: General: 25 year old male patient currently sedated breathing comfortably HENT: Normocephalic atraumatic Lungs: Snoring respiratory efforts but currently clear otherwise Cardiovascular: Regular rate and rhythm Abdomen: Soft not  tender Extremities: Warm dry Neuro: Sedated post Ativan GU: Due to void  Resolved Hospital Problem list     Assessment & Plan:  Acute toxic/metabolic encephalopathy secondary to alcohol withdrawal, unable to adequately managed with benzodiazepine taper Plan Initiate Precedex infusion x24 hours Once stabilized initiate Librium taper and clonidine taper Continue IV fluids Telemetry monitoring Pulse oximetry Seizure precautions RASS goal 0  Transaminates In the context of alcohol abuse.  Currently LFTs improving Plan AM chemistry  Chronic thrombocytopenia Plan Monitor  History of seizure, apparently noted seizure in the ER.Marland Kitchen Plan Serial neuro checks  Best Practice (right click and "Reselect all SmartList Selections" daily)   Diet/type: NPO w/ oral meds DVT prophylaxis: SCD GI prophylaxis: N/A Lines: N/A Foley:  N/A Code Status:  full code Last date of multidisciplinary goals of care discussion [pending ]  Labs   CBC: Recent Labs  Lab 06/27/21 2005 06/29/21 2340 06/30/21 0650 07/01/21 0335  WBC 7.6 4.9 6.6 8.3  NEUTROABS 4.9  --   --   --   HGB 13.9 12.6* 12.2* 12.2*  HCT 39.1 36.5* 34.8* 36.7*  MCV 90.7 92.9 93.0 94.3  PLT 72* 55* 58* 85*    Basic Metabolic Panel: Recent Labs  Lab 06/27/21 2005 06/29/21 2245 06/30/21 0650 07/01/21 0335  NA 130* 135 132* 133*  K 4.5 4.5 3.2* 3.9  CL 96* 102 102 103  CO2 21* 22 20* 21*  GLUCOSE 111* 99 79 98  BUN <5* 5* 5* <5*  CREATININE 0.89 0.69 0.68 0.67  CALCIUM 9.5 9.2 8.8* 8.6*  MG  --  1.6*  --  1.7  PHOS  --  3.6  --  3.0   GFR: CrCl cannot be calculated (Unknown ideal weight.). Recent Labs  Lab 06/27/21 2005 06/29/21 2055 06/29/21 2340 06/30/21 0650 07/01/21 0335  WBC 7.6  --  4.9 6.6 8.3  LATICACIDVEN  --  1.7  --   --   --     Liver Function Tests: Recent Labs  Lab 06/27/21 2005 06/29/21 2245 06/30/21 0650 07/01/21 0335  AST 1,201* 476* 348* 200*  ALT 177* 106* 88* 69*  ALKPHOS  74 56 51 55  BILITOT 2.3* 1.8* 1.7* 2.0*  PROT 8.4* 7.2 6.9 6.9  ALBUMIN 3.7 3.1* 2.9* 2.8*   No results for input(s): LIPASE, AMYLASE in the last 168 hours. Recent Labs  Lab 06/29/21 2055  AMMONIA 60*    ABG    Component Value Date/Time   HCO3 24.8 01/11/2020 1837   TCO2 26 01/11/2020 1837   O2SAT 81.0 01/11/2020 1837     Coagulation Profile: Recent Labs  Lab 06/29/21 1616  INR 1.1    Cardiac Enzymes: Recent Labs  Lab 06/29/21 2245 07/01/21 0335  CKTOTAL 8,229* 2,314*    HbA1C: No results found for: HGBA1C  CBG: No results for input(s): GLUCAP in the last 168 hours.  Review of Systems:   Unable   Past Medical History:  He,  has a past medical history of Known health problems: none.   Surgical History:   Past Surgical History:  Procedure Laterality Date   NO PAST SURGERIES       Social History:   reports that he has never smoked. He has never used smokeless tobacco. He reports current alcohol use. He reports that he does not use drugs.   Family History:  His family history is negative for Hypertension, Diabetes, Cancer, and Heart disease.   Allergies No Known Allergies   Home Medications  Prior to Admission medications   Medication Sig Start Date End Date Taking? Authorizing Provider  acetaminophen (TYLENOL) 500 MG tablet Take 1,000 mg by mouth every 6 (six) hours as needed for moderate pain or headache.   Yes [provider]  chlordiazePOXIDE (LIBRIUM) 25 MG capsule 50mg  PO TID x 1D, then 25-50mg  PO BID X 1D, then 25-50mg  PO QD X 1D Patient not taking: Reported on 06/29/2021 01/11/20   Long, 01/13/20, MD  diphenhydrAMINE (BENADRYL) 25 MG tablet Take 1 tablet (25 mg total) by mouth every 6 (six) hours as needed. Patient not taking: Reported on 06/29/2021 01/12/20   01/14/20, PA-C  famotidine (PEPCID) 20 MG tablet Take 1 tablet (20 mg total) by mouth 2 (two) times daily. Patient not taking: Reported on 06/29/2021 07/27/19   14/2/20, PA-C  sucralfate (CARAFATE) 1 GM/10ML suspension Take 10 mLs (1 g total) by mouth 4 (four) times daily -  with meals and at bedtime. Patient not taking: Reported on 06/29/2021 07/27/19   14/2/20, PA-C     Critical care time: 35 min     Emi Holes ACNP-BC [facility] Pulmonary/Critical Care Pager # [phone] OR # [phone] if no answer

## 2021-07-01 NOTE — Progress Notes (Addendum)
PROGRESS NOTE  Jason Burlylouise Moccio WUJ:[id number] DOB: 01/06/1996 DOA: 06/27/2021 PCP: Pcp, No   LOS: 1 day   Brief narrative:  Jason Carney is a 25 y.o. male with medical history looking for significant alcohol use, thrombocytopenia, seizure disorder presented to hospital with altered mental status in hallucinations.  He had significant agitation on presentation as well.  Family thought that his drink was over the weekend and he continued to have hallucinations.  Patient was seen at behavioral health but was sent to the ED for medical clearance.  In the ED patient was tachycardic.  Labs showed mild hyponatremia with AST of 200 and ALT of 177 and bilirubin of 2.3.  Platelet was 72.  Patient tested positive for flu.  UDS and ethanol level was negative.  Hepatitis panel was negative as well.  Chest x-ray CT head was negative.  Patient received several doses of Ativan in the ED and since there was concern for alcohol withdrawal so the patient was then considered for admission to the hospital.  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  Psychiatry has seen the patient and recommended continuation of CIWA protocol and standing doses of Ativan has been added.  Continue Zyprexa from home.   MRI of the brain was attempted which was truncated exam but no acute infarct was noted.  Secondary school teacherne-to-one sitter.   Alcohol use with elevated LFTs and thrombocytopenia. Patient has history of drinking 4-5 times a week.  Significant alcohol usage.  Had AST and ALT elevation significantly on admission, has been trending down.  Could be secondary to rhabdomyolysis as well.  Negative hepatitis panel.  Continue to  monitor.  Rhabdomyolysis.  Receiving IV fluids.  Downtrending CK, CK levels to 2314 today.  Hypokalemia.  Improved after replacement. Potassium at 3.9 today.  Check levels in a.m.  Influenza A.  Continue Tamiflu  Thrombocytopenia.  No evidence of bleeding.  Secondary to alcohol use.  We will continue to monitor.   Seizures On presentation.  Likely secondary to alcohol withdrawal.  No recommendation for antiepileptics.  No recurrent seizures.  Continue seizure precaution.    DVT prophylaxis: SCDs Start: 06/29/21 2157  Addendum:  07/01/2021 3:53 PM   I was notified by nurse that patient continued to have agitation, confusion, disorientation and was trying to get out of the bed.  Patient has received at least 15 mg of Ativan since the morning and still appears to be very delirious diaphoretic and hypertensive.  He is on restraints with one-to-one sitter.  Spoke with psychiatry and will try to avoid Haldol due to seizure threshold lowering properly.  The best option in this situation would be Precedex drip.  We will transfer the patient to the ICU.  Spoke with the PCCM for consultation.  Communicated with nursing staff.  Could not reach the patient's brother to update him about this status. Patient will be transferred to ICU service.  code Status: Full  Family Communication: Spoke with the patient's brother on the phone and updated him about the clinical condition of the patient.  Status is: Inpatient  The patient is inpatient because: continued treatment of alcohol withdrawal  Consultants: Neurology- Psychiatry  Procedures: None  Anti-infectives:  None  Anti-infectives (From admission,  onward)    Start     Dose/Rate Route Frequency Ordered Stop   06/30/21 1300  oseltamivir (TAMIFLU) capsule 75 mg        75 mg Oral 2 times daily 06/30/21 1157 07/05/21 0959      Subjective: Today, patient was seen and examined at bedside.  Appears to be more alert awake and communicative but  still disoriented.  Trying to get out of the bed at times  Objective: Vitals:   07/01/21 0708 07/01/21 1045  BP: (!) 139/98   Pulse: 98 100  Resp: 16   Temp: 98.4 F (36.9 C)   SpO2: 99%     Intake/Output Summary (Last 24 hours) at 07/01/2021 1201 Last data filed at 07/01/2021 0600 Gross per 24 hour  Intake 1614.31 ml  Output 900 ml  Net 714.31 ml    There were no vitals filed for this visit. There is no height or weight on file to calculate BMI.   Physical Exam: GENERAL: Patient is alert awake but disoriented and confused.  Not in obvious distress. HENT: No scleral pallor or icterus. Pupils equally reactive to light. Oral mucosa is moist, erythema noted over the right facial area. NECK: is supple, no gross swelling noted. CHEST:  No crackles or wheezes.  Breath sounds heard bilaterally. CVS: S1 and S2 heard, no murmur. Regular rate and rhythm.  ABDOMEN: Soft, non-tender, bowel sounds are present. EXTREMITIES: No edema. CNS: Moves all extremities, confused, disoriented, communicative. SKIN: warm and dry without rashes.  Data Review: I have personally reviewed the following laboratory data and studies,  CBC: Recent Labs  Lab 06/27/21 2005 06/29/21 2340 06/30/21 0650 07/01/21 0335  WBC 7.6 4.9 6.6 8.3  NEUTROABS 4.9  --   --   --   HGB 13.9 12.6* 12.2* 12.2*  HCT 39.1 36.5* 34.8* 36.7*  MCV 90.7 92.9 93.0 94.3  PLT 72* 55* 58* 85*    Basic Metabolic Panel: Recent Labs  Lab 06/27/21 2005 06/29/21 2245 06/30/21 0650 07/01/21 0335  NA 130* 135 132* 133*  K 4.5 4.5 3.2* 3.9  CL 96* 102 102 103  CO2 21* 22 20* 21*  GLUCOSE 111* 99 79 98  BUN <5* 5* 5* <5*  CREATININE 0.89 0.69 0.68 0.67  CALCIUM 9.5 9.2 8.8* 8.6*  MG  --  1.6*  --  1.7  PHOS  --  3.6  --  3.0    Liver Function Tests: Recent Labs  Lab 06/27/21 2005 06/29/21 2245 06/30/21 0650 07/01/21 0335  AST 1,201* 476* 348* 200*  ALT 177* 106* 88* 69*  ALKPHOS 74 56 51 55  BILITOT 2.3* 1.8*  1.7* 2.0*  PROT 8.4* 7.2 6.9 6.9  ALBUMIN 3.7 3.1* 2.9* 2.8*    No results for input(s): LIPASE, AMYLASE in the last 168 hours. Recent Labs  Lab 06/29/21 2055  AMMONIA 60*    Cardiac Enzymes: Recent Labs  Lab 06/29/21 2245 07/01/21 0335  CKTOTAL 8,229* 2,314*    BNP (last 3 results) No results for input(s): BNP in the last 8760 hours.  ProBNP (last 3 results) No results for input(s): PROBNP in the last 8760 hours.  CBG: No results for input(s): GLUCAP in the last 168 hours. Recent Results (from the past 240 hour(s))  Resp Panel by RT-PCR (Flu A&B, Covid) Nasopharyngeal Swab     Status: Abnormal   Collection Time: 06/27/21  7:55 PM   Specimen: Nasopharyngeal Swab; Nasopharyngeal(NP) swabs in vial transport medium  Result Value Ref Range  Status   SARS Coronavirus 2 by RT PCR NEGATIVE NEGATIVE Final    Comment: (NOTE) SARS-CoV-2 target nucleic acids are NOT DETECTED.  The SARS-CoV-2 RNA is generally detectable in upper respiratory specimens during the acute phase of infection. The lowest concentration of SARS-CoV-2 viral copies this assay can detect is 138 copies/mL. A negative result does not preclude SARS-Cov-2 infection and should not be used as the sole basis for treatment or other patient management decisions. A negative result may occur with  improper specimen collection/handling, submission of specimen other than nasopharyngeal swab, presence of viral mutation(s) within the areas targeted by this assay, and inadequate number of viral copies(<138 copies/mL). A negative result must be combined with clinical observations, patient history, and epidemiological information. The expected result is Negative.  Fact Sheet for Patients:  EntrepreneurPulse.com.au  Fact Sheet for Healthcare Providers:  IncredibleEmployment.be  This test is no t yet approved or cleared by the [place] FDA and  has been authorized for detection  and/or diagnosis of SARS-CoV-2 by FDA under an Emergency Use Authorization (EUA). This EUA will remain  in effect (meaning this test can be used) for the duration of the COVID-19 declaration under Section 564(b)(1) of the Act, 21 U.S.C.section 360bbb-3(b)(1), unless the authorization is terminated  or revoked sooner.       Influenza A by PCR POSITIVE (A) NEGATIVE Final   Influenza B by PCR NEGATIVE NEGATIVE Final    Comment: (NOTE) The Xpert Xpress SARS-CoV-2/FLU/RSV plus assay is intended as an aid in the diagnosis of influenza from Nasopharyngeal swab specimens and should not be used as a sole basis for treatment. Nasal washings and aspirates are unacceptable for Xpert Xpress SARS-CoV-2/FLU/RSV testing.  Fact Sheet for Patients: EntrepreneurPulse.com.au  Fact Sheet for Healthcare Providers: IncredibleEmployment.be  This test is not yet approved or cleared by the [place] FDA and has been authorized for detection and/or diagnosis of SARS-CoV-2 by FDA under an Emergency Use Authorization (EUA). This EUA will remain in effect (meaning this test can be used) for the duration of the COVID-19 declaration under Section 564(b)(1) of the Act, 21 U.S.C. section 360bbb-3(b)(1), unless the authorization is terminated or revoked.  Performed at [facility], [place] [address] [place], [place] [address]       Studies: CT HEAD WO CONTRAST (5MM)  Result Date: 06/29/2021 CLINICAL DATA:  Delirium. EXAM: CT HEAD WITHOUT CONTRAST TECHNIQUE: Contiguous axial images were obtained from the base of the skull through the vertex without intravenous contrast. COMPARISON:  01/11/2020 FINDINGS: Brain: Mild atrophy, advanced for age. no evidence of acute infarction, hemorrhage, hydrocephalus, extra-axial collection or mass lesion/mass effect. Vascular: No hyperdense vessel or unexpected calcification. Skull: Normal. Negative for fracture or focal lesion.  Sinuses/Orbits: No acute finding. Other: None. IMPRESSION: 1. No acute intracranial abnormality. 2. Mild atrophy, advanced for age. Electronically Signed   By: Keith Rake M.D.   On: 06/29/2021 19:55   MR BRAIN WO CONTRAST  Result Date: 06/30/2021 CLINICAL DATA:  Psychosis EXAM: MRI HEAD WITHOUT CONTRAST TECHNIQUE: Multiplanar, multiecho pulse sequences of the brain and surrounding structures were obtained without intravenous contrast. COMPARISON:  None. FINDINGS: Only diffusion-weighted imaging, axial T2-weighted imaging and sagittal T1-weighted imaging were performed. There is no acute infarct. No midline shift or other mass effect. Midline structures are normal. IMPRESSION: Truncated examination. No acute infarct. Electronically Signed   By: Ulyses Jarred M.D.   On: 06/30/2021 00:35   DG Chest Portable 1 View  Result Date: 06/29/2021 CLINICAL DATA:  Evaluate for possible foreign body EXAM: PORTABLE CHEST 1 VIEW COMPARISON:  08/03/2015 FINDINGS: The heart size and mediastinal contours are within normal limits. Both lungs are clear. The visualized skeletal structures are unremarkable. IMPRESSION: No acute abnormality noted. Specifically no radiopaque foreign body is noted. Electronically Signed   By: Inez Catalina M.D.   On: 06/29/2021 20:16      Flora Lipps, MD  [facility] 07/01/2021  If 7PM-7AM, please contact night-coverage

## 2021-07-01 NOTE — BH Assessment (Signed)
Care Management - Follow Up [facility] Discharges   Patient has been admitted to medical floor for medical observation due to substance abuse withdrawal symptoms and caution for alcoholic seizures.

## 2021-07-01 NOTE — Hospital Course (Signed)
Abogo and mom: Reports he drinks on a daily basis from morning to evening.  Reports that he has seen the patient have hallucinations. Patient reports he has also seen patient have seizures.  VH 3 times- It started around 2 years.   AH as well.   He does often speak about witches being after him.  FH - per mom, he is the only who appears to have SUD, EtoH - No FH of symptoms of psychosis

## 2021-07-01 NOTE — Consult Note (Signed)
[facility] Face-to-Face Psychiatry Consult   Reason for Consult:  Psychosis. Possible component of EtoH withdrawal. Help to clarify if presentation is typical psychosis. Referring Physician:  Beola Cord, MD Patient Identification: Jason Carney MRN:  [id number] Principal Diagnosis: Alcohol withdrawal [facility]) Diagnosis:  Principal Problem:   Alcohol withdrawal (HCC) Active Problems:   Thrombocytopenia (HCC)   Elevated blood pressure reading   Abnormal liver enzymes   Alcohol use disorder, severe, dependence (HCC)   History of seizure due to alcohol withdrawal Assessment  Jason Carney is a 25 y.o. male admitted medically  06/27/2021  7:45 PM for AMS.Patient no known previous psychiatric dx.  and has a past medical history of significant alcohol use, thrombocytopenia, LFT elevation, seizure  .Psychiatry was consulted for evaluation of underlying functional psychosis vs EtoH withdrawal.   He meets criteria for SUD, EtoH based on labs and collateral from family.  On initial examination, patient appears confused and hallucination. We plan to assist in patient treatment for withdrawal .   Exam focuses on withcraft, pt's relationship with God, etc, and he is difficult to redirect from these topics. Providers did use an interpreter and despite this patient appeared to be disorganized in his speech and still had trouble following instructions or answering questions coherently. Patient's family endorses no hx of this behavior prior to his [facility] intake, and endorses that he has had this behavior multiple times since he began drinking. However, some of what patient mentioned about his life in [place] remains concerning for possible psychosis vs cultural beliefs. Patient endorses a possible hx of paranoia and it is possible that family did not notice his paranoia as he uses cultural norms to talk about his paranoia. Family appeared to become concerned when patient began having seizures, but they also endorse the  cultural belief that this may be 2/2 witchcraft. Despite this family is very concerned about patient's EtOH intake and do not believe it is healthy. Patient appears to drink more than he is endorsing and we recommend increasing Ativan. There is additional concern that patient may be a bit delirious as patient is also noted to be flu positive and had a temp of 103.3 when he came in.   EtOh withdrawal vs Psychosis, unspecified - Continue EtoH detox (Ativan 0-4mg  q12 per CIWA score) - Continue Ativan 1mg  q4h PRN - Start Ativan 1mg  TID - Continue Zyprexa 5mg  BID - Continue Thiamine 100mg  Daily  Flu - Per primary  Safety At this time patient does appear to be of moderate risk of self-harm  and this is based off of patient's assessment and EMR. - Continue 1:1 sitter  Dispo - Per primary at this time Total Time spent with patient: 1 hour  Subjective:   Jason Carney is a 25 y.o. male patient admitted with AMS and transamnitis.  HPI:   Patient reports that he drinks up to 5-6 bottles of beer a day when not working, and 2 bottles when working. He stopped work about a month ago but has not drunk daily during that time. We discussed the reason we were asking these questions. Per ED note pt's family is concerned for significanlty more EtOH ingestion than pt is endorsing.      Patient reports that he has had hallucinations. Thinks the most recent time was a couple of months ago. Patient reports that he could hear someone talking to him and following him, but when he turned around there was no one. He was not drinking when his happened and doesn't think  he had had a seizure around that time. Patient reports that he "can't see evil things, but can see things of God. People who can see evil things do witchcraft." Patient reports that he believes he can be touched by evil things/witchcraft and he would not be able to feel it. States God has built [place] all differently and discusses his religious views for  some time. Provider asked patient about why he got the tattoo stating "God is able" and reports that it was because he thinks he was cursed in [place] by witches leaving food outside of his door.   Patient reports that he has been in the [place] with his family for 6 years. Patient reports that he was working at [employer] but has been without a job the past month. Patient denies SI,HI on assessment. Patient denies VH as well; however towards the end of assessment patient attempted to get up and leave the room reporting "that's my ride" and pointed towards the open door. Patient is adamant that he has not SI and talks about how  he would hate someone who would do witchcraft on him. He askes Dr. Dustin Carney if her "eyes are gifted" and if she saw him in her dreams when she asks about patient's previous delusions noted in the ED concerning"monkeys trying to steal my legs." Patient does not ever answer if he still worried about this and begins talking about other things.   ON exam he is tremulous. Occasionally says things that make no sense (says there is an alien in his alcohol) although this was before we got the interpreter. Became hyperfocused on the the technical instructions to the sitter's COW and asked several times to have it (was under the impression it was an important family document, rambled about taxes, birth records, etc).       Contacts: Jason Carney brother [phone] Jason Carney/Mom: [phone]   Collateral, Jason Carney: Jason Carney reports that he is 25 yo and reports taht he see's his brother drink,but thinks that his mother likely see's him drink more. Jason Carney reports that he has seen his brother hallucinate most recently on Thursday when they brought him to the hospital.   Collateral, Jason Carney and Mom:  Jason Carney started on the phone and mom was in the background while phone interpreter was used. Mom eventually took the phone. Mom reports that he drinks a lot of EtoH from morning to evening, and does not eat a  lot. Family is concerned that "the witches" have caused him to be like this. Believe that the witches also caused his seizures. Seizures started 2 years ago. Mom said the hallucinations started after the drinking. Per Jason Carney the patient has AVH at least 3 times in the past 2 years. Mom and Jason Goodell do not recall patient every having seizures or AVH prior to his drinking. Mom reports that she is not sure why the patient started drinking. In [place] family was in a "camp" and the family was healthy. Patient have been in [place] for 6 years ago and did well the first started 4 years.     From ED notes:  TTS contacted patient's brother Jason Carney [phone] who states that patient has been seeing things in the house that are not there, he stays up all night, he tries to leave the house at night and they are not sure where he is going.  Brother states that patient is drinking heavily almost daily and states that he has fallen seven times in the past week. He states that  patient would be drinking daily if he had the resources, but he is currently unemployed. Brother also reports that patient has been having "seizures." He states that patient has never been treated for mental illness and states that he has never been on any mental health medications. He also reports that patient has never been suicidal or homicidal that he knows of.  Patient presents as being very disorganized.          Past Psychiatric History: None known   Past Medical History:  Past Medical History:  Diagnosis Date   Known health problems: none     Past Surgical History:  Procedure Laterality Date   NO PAST SURGERIES     Family History:  Family History  Problem Relation Age of Onset   Hypertension Neg Hx    Diabetes Neg Hx    Cancer Neg Hx    Heart disease Neg Hx    Family Psychiatric  History: None known, no one else appears to have SUD, per mom. Social History:  Social History   Substance and Sexual Activity  Alcohol Use Yes      Social History   Substance and Sexual Activity  Drug Use No    Social History   Socioeconomic History   Marital status: Single    Spouse name: Not on file   Number of children: Not on file   Years of education: Not on file   Highest education level: Not on file  Occupational History   Not on file  Tobacco Use   Smoking status: Never   Smokeless tobacco: Never  Vaping Use   Vaping Use: Never used  Substance and Sexual Activity   Alcohol use: Yes   Drug use: No   Sexual activity: Not on file  Other Topics Concern   Not on file  Social History Narrative   Not on file   Social Determinants of Health   Financial Resource Strain: Not on file  Food Insecurity: Not on file  Transportation Needs: Not on file  Physical Activity: Not on file  Stress: Not on file  Social Connections: Not on file   Additional Social History:    Allergies:  No Known Allergies  Labs:  Results for orders placed or performed during the hospital encounter of 06/27/21 (from the past 48 hour(s))  Protime-INR     Status: None   Collection Time: 06/29/21  4:16 PM  Result Value Ref Range   Prothrombin Time 13.9 11.4 - 15.2 seconds   INR 1.1 0.8 - 1.2    Comment: (NOTE) INR goal varies based on device and disease states. Performed at [facility], [place] [address], [place], [place] [address]   Ammonia     Status: Abnormal   Collection Time: 06/29/21  8:55 PM  Result Value Ref Range   Ammonia 60 (H) 9 - 35 umol/L    Comment: Performed at [facility], [place] [address], [place], [place] [address]  Lactic acid, plasma     Status: None   Collection Time: 06/29/21  8:55 PM  Result Value Ref Range   Lactic Acid, Venous 1.7 0.5 - 1.9 mmol/L    Comment: Performed at [facility] [address], [place], [place] [address]  CK     Status: Abnormal   Collection Time: 06/29/21 10:45 PM  Result Value Ref Range   Total CK 8,229 (H) 49 - 397 U/L    Comment: RESULTS CONFIRMED BY  MANUAL DILUTION  Performed at [facility], [place] [address] [place], [place] [address]   Comprehensive metabolic panel     Status: Abnormal   Collection Time: 06/29/21 10:45 PM  Result Value Ref Range   Sodium 135 135 - 145 mmol/L   Potassium 4.5 3.5 - 5.1 mmol/L    Comment: SLIGHT HEMOLYSIS   Chloride 102 98 - 111 mmol/L   CO2 22 22 - 32 mmol/L   Glucose, Bld 99 70 - 99 mg/dL    Comment: Glucose reference range applies only to samples taken after fasting for at least 8 hours.   BUN 5 (L) 6 - 20 mg/dL   Creatinine, Ser 0.69 0.61 - 1.24 mg/dL   Calcium 9.2 8.9 - 10.3 mg/dL   Total Protein 7.2 6.5 - 8.1 g/dL   Albumin 3.1 (L) 3.5 - 5.0 g/dL   AST 476 (H) 15 - 41 U/L   ALT 106 (H) 0 - 44 U/L   Alkaline Phosphatase 56 38 - 126 U/L   Total Bilirubin 1.8 (H) 0.3 - 1.2 mg/dL   GFR, Estimated >60 >60 mL/min    Comment: (NOTE) Calculated using the CKD-EPI Creatinine Equation (2021)    Anion gap 11 5 - 15    Comment: Performed at [facility], [place] [address], [place], [place] [address]  Magnesium     Status: Abnormal   Collection Time: 06/29/21 10:45 PM  Result Value Ref Range   Magnesium 1.6 (L) 1.7 - 2.4 mg/dL    Comment: Performed at [facility] [address] [place], [place] [address]  Phosphorus     Status: None   Collection Time: 06/29/21 10:45 PM  Result Value Ref Range   Phosphorus 3.6 2.5 - 4.6 mg/dL    Comment: Performed at [facility], [place] [address] [place], [place] [address]  CBC     Status: Abnormal   Collection Time: 06/29/21 11:40 PM  Result Value Ref Range   WBC 4.9 4.0 - 10.5 K/uL   RBC 3.93 (L) 4.22 - 5.81 MIL/uL   Hemoglobin 12.6 (L) 13.0 - 17.0 g/dL   HCT 36.5 (L) 39.0 - 52.0 %   MCV 92.9 80.0 - 100.0 fL   MCH 32.1 26.0 - 34.0 pg   MCHC 34.5 30.0 - 36.0 g/dL   RDW 11.8 11.5 - 15.5 %   Platelets 55 (L) 150 - 400 K/uL    Comment: Immature Platelet Fraction may be clinically indicated, consider ordering this additional  test [id number] CONSISTENT WITH PREVIOUS RESULT REPEATED TO VERIFY    nRBC 0.0 0.0 - 0.2 %    Comment: Performed at [facility], [place] [address] [place],  [address]  Comprehensive metabolic panel     Status: Abnormal   Collection Time: 06/30/21  6:50 AM  Result Value Ref Range   Sodium 132 (L) 135 - 145 mmol/L   Potassium 3.2 (L) 3.5 - 5.1 mmol/L   Chloride 102 98 - 111 mmol/L   CO2 20 (L) 22 - 32 mmol/L   Glucose, Bld 79 70 - 99 mg/dL    Comment: Glucose reference range applies only to samples taken after fasting for at least 8 hours.   BUN 5 (L) 6 - 20 mg/dL   Creatinine, Ser 0.68 0.61 - 1.24 mg/dL   Calcium 8.8 (L) 8.9 - 10.3 mg/dL   Total Protein 6.9 6.5 - 8.1 g/dL   Albumin 2.9 (L) 3.5 - 5.0 g/dL   AST  348 (H) 15 - 41 U/L   ALT 88 (H) 0 - 44 U/L   Alkaline Phosphatase 51 38 - 126 U/L   Total Bilirubin 1.7 (H) 0.3 - 1.2 mg/dL   GFR, Estimated >60 >60 mL/min    Comment: (NOTE) Calculated using the CKD-EPI Creatinine Equation (2021)    Anion gap 10 5 - 15    Comment: Performed at [facility] [address], [place], [place] [address]  CBC     Status: Abnormal   Collection Time: 06/30/21  6:50 AM  Result Value Ref Range   WBC 6.6 4.0 - 10.5 K/uL   RBC 3.74 (L) 4.22 - 5.81 MIL/uL   Hemoglobin 12.2 (L) 13.0 - 17.0 g/dL   HCT 34.8 (L) 39.0 - 52.0 %   MCV 93.0 80.0 - 100.0 fL   MCH 32.6 26.0 - 34.0 pg   MCHC 35.1 30.0 - 36.0 g/dL   RDW 11.7 11.5 - 15.5 %   Platelets 58 (L) 150 - 400 K/uL    Comment: SPECIMEN CHECKED FOR CLOTS Immature Platelet Fraction may be clinically indicated, consider ordering this additional test [id number] CONSISTENT WITH PREVIOUS RESULT REPEATED TO VERIFY    nRBC 0.0 0.0 - 0.2 %    Comment: Performed at [facility], [place] [address] [place], [place] [address]  Magnesium     Status: None   Collection Time: 07/01/21  3:35 AM  Result Value Ref Range   Magnesium 1.7 1.7 - 2.4 mg/dL    Comment: Performed at [facility] [address] [place], [place] [address]  Phosphorus     Status: None   Collection Time: 07/01/21  3:35 AM  Result Value Ref Range   Phosphorus 3.0 2.5 - 4.6 mg/dL    Comment: Performed at [facility] [address] [place], [place] [address]  CK     Status: Abnormal   Collection Time: 07/01/21  3:35 AM  Result Value Ref Range   Total CK 2,314 (H) 49 - 397 U/L    Comment: Performed at [facility], [place] [address] [place], [place] [address]  Comprehensive metabolic panel     Status: Abnormal   Collection Time: 07/01/21  3:35 AM  Result Value Ref Range   Sodium 133 (L) 135 - 145 mmol/L   Potassium 3.9 3.5 - 5.1 mmol/L   Chloride 103 98 - 111 mmol/L   CO2 21 (L) 22 - 32 mmol/L   Glucose, Bld 98 70 - 99 mg/dL    Comment: Glucose reference range applies only to samples taken after fasting for at least 8 hours.   BUN <5 (L) 6 - 20 mg/dL   Creatinine, Ser 0.67 0.61 - 1.24 mg/dL   Calcium 8.6 (L) 8.9 - 10.3 mg/dL   Total Protein 6.9 6.5 - 8.1 g/dL   Albumin 2.8 (L) 3.5 - 5.0 g/dL   AST 200 (H) 15 - 41 U/L   ALT 69 (H) 0 - 44 U/L   Alkaline Phosphatase 55 38 - 126 U/L   Total Bilirubin 2.0 (H) 0.3 - 1.2 mg/dL   GFR, Estimated >60 >60 mL/min    Comment: (NOTE) Calculated using the CKD-EPI Creatinine Equation (2021)    Anion gap 9 5 - 15    Comment: Performed at [facility], [place] [address] [place], [place] [address]  CBC     Status: Abnormal   Collection Time: 07/01/21  3:35 AM  Result Value Ref Range   WBC 8.3 4.0 - 10.5 K/uL   RBC 3.89 (L) 4.22 - 5.81 MIL/uL   Hemoglobin 12.2 (L) 13.0 - 17.0 g/dL   HCT 36.7 (L) 39.0 - 52.0 %   MCV 94.3 80.0 - 100.0 fL   MCH 31.4 26.0 - 34.0 pg   MCHC 33.2 30.0 - 36.0 g/dL   RDW 11.8 11.5 - 15.5 %   Platelets 85 (L) 150 - 400 K/uL    Comment: Immature Platelet Fraction may be clinically indicated, consider ordering this additional test [id number] CONSISTENT WITH PREVIOUS RESULT REPEATED TO VERIFY    nRBC  0.0 0.0 - 0.2 %    Comment: Performed at [facility], [place] [address] [place], [place] [address]    Current Facility-Administered Medications  Medication Dose Route Frequency Provider Last Rate Last Admin   0.45 % NaCl with KCl 20 mEq / L infusion   Intravenous Continuous Pokhrel, Laxman, MD 100 mL/hr at 07/01/21 0022 1,000 mL at 07/01/21 0022   acetaminophen (TYLENOL) tablet 650 mg  650 mg Oral Q6H PRN Marcelyn Bruins, MD   650 mg at 06/30/21 2057   Or   acetaminophen (TYLENOL) suppository 650 mg  650 mg Rectal Q6H PRN Marcelyn Bruins, MD       folic acid (FOLVITE) tablet 1 mg  1 mg Oral Daily Marcelyn Bruins, MD   1 mg at 07/01/21 0809   LORazepam (ATIVAN) injection 0-4 mg  0-4 mg Intravenous Q12H Marcelyn Bruins, MD   2 mg at 07/01/21 0140   Or   LORazepam (ATIVAN) tablet 0-4 mg  0-4 mg Oral Q12H Marcelyn Bruins, MD       LORazepam (ATIVAN) injection 1 mg  1 mg Intravenous Once Marcelyn Bruins, MD       LORazepam (ATIVAN) injection 1 mg  1 mg Intravenous Q4H PRN Pokhrel, Laxman, MD   1 mg at 07/01/21 1041   multivitamin with minerals tablet 1 tablet  1 tablet Oral Daily Marcelyn Bruins, MD   1 tablet at 07/01/21 0809   OLANZapine zydis (ZYPREXA) disintegrating tablet 5 mg  5 mg Oral BID Marcelyn Bruins, MD   5 mg at 07/01/21 0809   oseltamivir (TAMIFLU) capsule 75 mg  75 mg Oral BID Pokhrel, Laxman, MD   75 mg at 07/01/21 0809   polyethylene glycol (MIRALAX / GLYCOLAX) packet 17 g  17 g Oral Daily PRN Marcelyn Bruins, MD       sodium chloride flush (NS) 0.9 % injection 3 mL  3 mL Intravenous Q12H Marcelyn Bruins, MD   3 mL at 07/01/21 0810   thiamine tablet 100 mg  100 mg Oral Daily Marcelyn Bruins, MD   100 mg at 07/01/21 [id number]   Or   thiamine (B-1) injection 100 mg  100 mg Intravenous Daily Marcelyn Bruins, MD       Psychiatric Specialty Exam: DWOB were incorrect and patient repeated some days. Able to name a pen and keys and their  use. Unable to count backwards from 42 to 27 and required multiple redirections.   Thought process: It was told to patient multiple times by interpreter that these were yes or no questions.  Does a stone Float on water?  Response " Depends on how heavy the stone." Are there fish in the sea? Response "There are fish." Does 1kg > 2kg? Response " I don't know I have never held those."  Does a hammer pound a nail?  Response "Yes, but not itself."  Presentation  General Appearance: Appropriate for Environment  Eye Contact:Fair  Speech:Clear and Coherent  Speech Volume:Normal  Handedness:No data recorded  Mood and Affect  Mood:Anxious; Euthymic  Affect:Congruent   Thought Process  Thought Processes:Disorganized  Descriptions of Associations:Circumstantial  Orientation:Full (Time, Place and Person) (knows his name, the year, and that he is in the hospital and why.)  Thought Content:Scattered  History of Schizophrenia/Schizoaffective disorder:No  Duration of Psychotic Symptoms:Greater than six months  Hallucinations:Hallucinations: Visual  Ideas of Reference:Delusions (? possibly cutltural but appear aggressive to be strictly cultural)  Suicidal Thoughts:Suicidal Thoughts: No  Homicidal Thoughts:Homicidal Thoughts: No   Sensorium  Memory:Immediate Fair; Recent Fair; Remote Good  Judgment:Impaired  Insight:None   Executive Functions  Concentration:Poor  Attention Span:Fair  [place]   Psychomotor Activity  Psychomotor Activity:Psychomotor Activity: Tremor   Assets  Assets:Desire for Improvement; Social Support; Housing   Sleep  Sleep:Sleep: Fair   Physical Exam: Physical Exam HENT:     Head: Normocephalic and atraumatic.  Pulmonary:     Effort: Pulmonary effort is normal.  Musculoskeletal:     Comments: As a resting tremor  Skin:    General: Skin is dry.     Comments: Numerous  abrasions/lacerations to skin which pt states are from soccer.  Neurological:     Mental Status: He is alert and oriented to person, place, and time.     Comments: Dysmetria noted on FTN, Had trouble following instructions despite interpreter   Review of Systems  Psychiatric/Behavioral:  Positive for substance abuse. Negative for suicidal ideas.   Blood pressure (!) 139/98, pulse 98, temperature 98.4 F (36.9 C), temperature source Oral, resp. rate 16, SpO2 99 %. There is no height or weight on file to calculate BMI.   PGY-2 Freida Busman, MD 07/01/2021 11:35 AM

## 2021-07-02 DIAGNOSIS — F29 Unspecified psychosis not due to a substance or known physiological condition: Secondary | ICD-10-CM

## 2021-07-02 DIAGNOSIS — R443 Hallucinations, unspecified: Secondary | ICD-10-CM

## 2021-07-02 DIAGNOSIS — F10931 Alcohol use, unspecified with withdrawal delirium: Secondary | ICD-10-CM

## 2021-07-02 LAB — PHOSPHORUS: Phosphorus: 4.4 mg/dL (ref 2.5–4.6)

## 2021-07-02 LAB — COMPREHENSIVE METABOLIC PANEL
ALT: 52 U/L — ABNORMAL HIGH (ref 0–44)
AST: 106 U/L — ABNORMAL HIGH (ref 15–41)
Albumin: 2.6 g/dL — ABNORMAL LOW (ref 3.5–5.0)
Alkaline Phosphatase: 58 U/L (ref 38–126)
Anion gap: 9 (ref 5–15)
BUN: <5 mg/dL — ABNORMAL LOW (ref 6–20)
CO2: 19 mmol/L — ABNORMAL LOW (ref 22–32)
Calcium: 8.9 mg/dL (ref 8.9–10.3)
Chloride: 107 mmol/L (ref 98–111)
Creatinine, Ser: 0.5 mg/dL — ABNORMAL LOW (ref 0.61–1.24)
GFR, Estimated: >60 mL/min (ref >60)
Glucose, Bld: 110 mg/dL — ABNORMAL HIGH (ref 70–99)
Potassium: 3.9 mmol/L (ref 3.5–5.1)
Sodium: 135 mmol/L (ref 135–145)
Total Bilirubin: 1.1 mg/dL (ref 0.3–1.2)
Total Protein: 6.5 g/dL (ref 6.5–8.1)

## 2021-07-02 LAB — GLUCOSE, CAPILLARY
Glucose-Capillary: 125 mg/dL — ABNORMAL HIGH (ref 70–99)
Glucose-Capillary: 128 mg/dL — ABNORMAL HIGH (ref 70–99)
Glucose-Capillary: 82 mg/dL (ref 70–99)

## 2021-07-02 LAB — CBC
HCT: 34 % — ABNORMAL LOW (ref 39.0–52.0)
Hemoglobin: 11.8 g/dL — ABNORMAL LOW (ref 13.0–17.0)
MCH: 32.6 pg (ref 26.0–34.0)
MCHC: 34.7 g/dL (ref 30.0–36.0)
MCV: 93.9 fL (ref 80.0–100.0)
Platelets: 114 10*3/uL — ABNORMAL LOW (ref 150–400)
RBC: 3.62 MIL/uL — ABNORMAL LOW (ref 4.22–5.81)
RDW: 11.6 % (ref 11.5–15.5)
WBC: 4.2 10*3/uL (ref 4.0–10.5)
nRBC: 0 % (ref 0.0–0.2)

## 2021-07-02 LAB — MAGNESIUM: Magnesium: 1.7 mg/dL (ref 1.7–2.4)

## 2021-07-02 MED ORDER — HYDRALAZINE HCL 20 MG/ML IJ SOLN
10.0000 mg | INTRAMUSCULAR | Status: DC | PRN
  Administered 2021-07-02: 10 mg via INTRAVENOUS
  Filled 2021-07-02: qty 1

## 2021-07-02 MED ORDER — AMLODIPINE BESYLATE 5 MG PO TABS
5.0000 mg | ORAL_TABLET | Freq: Every day | ORAL | Status: DC
  Administered 2021-07-02 – 2021-07-04 (×3): 5 mg via ORAL
  Filled 2021-07-02 (×4): qty 1

## 2021-07-02 MED ORDER — ENOXAPARIN SODIUM 40 MG/0.4ML IJ SOSY
40.0000 mg | PREFILLED_SYRINGE | INTRAMUSCULAR | Status: DC
  Administered 2021-07-02 – 2021-07-04 (×3): 40 mg via SUBCUTANEOUS
  Filled 2021-07-02 (×3): qty 0.4

## 2021-07-02 MED ORDER — CHLORDIAZEPOXIDE HCL 25 MG PO CAPS
50.0000 mg | ORAL_CAPSULE | Freq: Three times a day (TID) | ORAL | Status: AC
  Administered 2021-07-02 – 2021-07-03 (×6): 50 mg via ORAL
  Filled 2021-07-02 (×6): qty 2

## 2021-07-02 MED ORDER — MAGNESIUM SULFATE 4 GM/100ML IV SOLN
4.0000 g | Freq: Once | INTRAVENOUS | Status: AC
  Administered 2021-07-02: 4 g via INTRAVENOUS
  Filled 2021-07-02: qty 100

## 2021-07-02 MED ORDER — HYDRALAZINE HCL 20 MG/ML IJ SOLN: 10.0000 mg | INTRAMUSCULAR | Status: DC | PRN

## 2021-07-02 NOTE — Progress Notes (Signed)
NAMEDaxton Carney, MRN:  [id number], DOB:  [birth year], LOS: 2 ADMISSION DATE:  06/27/2021, CONSULTATION DATE:  07/01/2021 REFERRING MD:  Tyson Babinski, CHIEF COMPLAINT:  Agitated delirium   History of Present Illness:  Jason Carney is a 25 y.o. M with history of alcohol abuse who presented to [facility] ED for involuntary commitment. He was evaluated at behavioral health urgent care where he was exhibiting paranoid and delusional thoughts, stating that he felt that his brother and a monkey were coming to steal his legs. He was having hallucinations and seeing moving objects that in reality were not moving. At that time he was transported to ED for further care.  In the ED the patient was quite agitated and aggressive. CT head was done which was unremarkable however MRI was truncated due to patient agitation. Patient's family was contacted and they informed staff that he does not have history of psychiatric illness however he did have a seizure 10/31. They endorse alcohol abuse with last drink believed to be 11/03.   Initial evaluation was significant for fever (resolved), tachycardia, agitation, diaphoresis, hallucinations, transaminitis, flu positive, negative UDS and ethanol level, negative hepatitis panel. CIWA protocol with ativan was initiated and he also was initiated on Zyprexa.   He was initially admitted to the floor where he had agitation refractory to verbal redirection and multiple doses of ativan. He was given haldol. He was also found to have elevated CK levels at which time he was initiated on IV half-normal saline with KCl. CK levels did come down.   Psychiatry was consulted and evaluated the patient on 11/07. Patient continued to be difficult to redirect, endorse history of paranoia, and possibly delirious. They recommended continuing CIWA with ativan, Zyprexa, and thiamine, in addition to adding scheduled ativan 1 mg TID. They also recommended continuing to have a 1:1 sitter.   He had  persistent agitation, confusion, and disorientation throughout the day 11/07 despite having received large quantities of ativan. The decision was made to hold Haldol due to it's seizure threshold lowering property and instead initiate Precedex ggt. He was transferred to ICU at that time.   Pertinent  Medical History  None reported  Significant Hospital Events: Including procedures, antibiotic start and stop dates in addition to other pertinent events   11/08 Transferred to ICU and initiated on Precedex ggt  Interim History / Subjective:  Patient is cooperative this morning and answers all questions. He reports feeling shaky due to being cold and weak as well as some cough. He states that he feels that his mind is clear. He denies illicit drug use and says that he drinks sometimes with last drink prior to presenting to [facility].  Objective   Blood pressure (!) 151/109, pulse 87, temperature 98 F (36.7 C), temperature source Axillary, resp. rate (!) 23, height 5\' 7"  (1.702 m), weight 67.4 kg, SpO2 100 %.        Intake/Output Summary (Last 24 hours) at 07/02/2021 1026 Last data filed at 07/02/2021 0900 Gross per 24 hour  Intake 2848.97 ml  Output 1750 ml  Net 1098.97 ml   Filed Weights   07/01/21 1653  Weight: 67.4 kg    Examination: Constitutional: Patient has body and head partially covered with blanket, laying on edge of bed against railing. No acute distress noted.  Cardio: Regular rate and rhythm. No murmurs, rubs, gallops. Pulm: Clear to auscultation bilaterally. Normal work of breathing on room air.  GU: Foley catheter in place. MSK: No lower extremity edema  noted.  Skin: Skin is warm and dry.  Neuro: No focal deficit noted.  Psych: Patient is slowed in speech but cooperative. Answers questions appropriately. Denies hallucinations, states that he feels that his mind is 'clear'.  Resolved Hospital Problem list   Hypokalemia  Assessment & Plan:  Agitation secondary to alcohol  withdrawal History of seizure Previously unable to manage with benzodiazepines resulting in ICU transfer for management with Precedex. - Continue Precedex infusion, RASS goal 0 - Start Librium 50 mg TID - Continue IVF - Telemetry - Seizure precautions   Transaminitis LFTs improving. - Trend CMP   Thrombocytopenia Platelet count improving.  - Will start Lovenox for DVT prophylaxis - Trend CBC  Best Practice (right click and "Reselect all SmartList Selections" daily)   Diet/type: Regular consistency (see orders) DVT prophylaxis: LMWH GI prophylaxis: N/A Lines: N/A Foley:  N/A Code Status:  full code Last date of multidisciplinary goals of care discussion [ ]   Labs   CBC: Recent Labs  Lab 06/27/21 2005 06/29/21 2340 06/30/21 0650 07/01/21 0335 07/02/21 0406  WBC 7.6 4.9 6.6 8.3 4.2  NEUTROABS 4.9  --   --   --   --   HGB 13.9 12.6* 12.2* 12.2* 11.8*  HCT 39.1 36.5* 34.8* 36.7* 34.0*  MCV 90.7 92.9 93.0 94.3 93.9  PLT 72* 55* 58* 85* 114*    Basic Metabolic Panel: Recent Labs  Lab 06/27/21 2005 06/29/21 2245 06/30/21 0650 07/01/21 0335 07/02/21 0406  NA 130* 135 132* 133* 135  K 4.5 4.5 3.2* 3.9 3.9  CL 96* 102 102 103 107  CO2 21* 22 20* 21* 19*  GLUCOSE 111* 99 79 98 110*  BUN <5* 5* 5* <5* <5*  CREATININE 0.89 0.69 0.68 0.67 0.50*  CALCIUM 9.5 9.2 8.8* 8.6* 8.9  MG  --  1.6*  --  1.7 1.7  PHOS  --  3.6  --  3.0 4.4   GFR: Estimated Creatinine Clearance: 130.8 mL/min (A) (by C-G formula based on SCr of 0.5 mg/dL (L)). Recent Labs  Lab 06/29/21 2055 06/29/21 2340 06/30/21 0650 07/01/21 0335 07/02/21 0406  WBC  --  4.9 6.6 8.3 4.2  LATICACIDVEN 1.7  --   --   --   --     Liver Function Tests: Recent Labs  Lab 06/27/21 2005 06/29/21 2245 06/30/21 0650 07/01/21 0335 07/02/21 0406  AST 1,201* 476* 348* 200* 106*  ALT 177* 106* 88* 69* 52*  ALKPHOS 74 56 51 55 58  BILITOT 2.3* 1.8* 1.7* 2.0* 1.1  PROT 8.4* 7.2 6.9 6.9 6.5  ALBUMIN 3.7  3.1* 2.9* 2.8* 2.6*   No results for input(s): LIPASE, AMYLASE in the last 168 hours. Recent Labs  Lab 06/29/21 2055  AMMONIA 60*    ABG    Component Value Date/Time   HCO3 24.8 01/11/2020 1837   TCO2 26 01/11/2020 1837   O2SAT 81.0 01/11/2020 1837     Coagulation Profile: Recent Labs  Lab 06/29/21 1616  INR 1.1    Cardiac Enzymes: Recent Labs  Lab 06/29/21 2245 07/01/21 0335  CKTOTAL 8,229* 2,314*    HbA1C: No results found for: HGBA1C  CBG: Recent Labs  Lab 07/01/21 1650 07/01/21 2002 07/01/21 2333 07/02/21 0303 07/02/21 0732  GLUCAP 80 97 128* 125* 82    Review of Systems:   Review of Systems  Constitutional:  Positive for chills. Negative for fever.  Respiratory:  Positive for cough. Negative for shortness of breath.   Cardiovascular:  Negative  for chest pain and palpitations.  Neurological:  Positive for weakness. Negative for seizures and headaches.  Psychiatric/Behavioral:  Negative for hallucinations.     Past Medical History:  He,  has a past medical history of Known health problems: none.   Surgical History:   Past Surgical History:  Procedure Laterality Date   NO PAST SURGERIES       Social History:   reports that he has never smoked. He has never used smokeless tobacco. He reports current alcohol use. He reports that he does not use drugs.   Family History:  His family history is negative for Hypertension, Diabetes, Cancer, and Heart disease.   Allergies No Known Allergies   Home Medications  Prior to Admission medications   Medication Sig Start Date End Date Taking? Authorizing Provider  acetaminophen (TYLENOL) 500 MG tablet Take 1,000 mg by mouth every 6 (six) hours as needed for moderate pain or headache.   Yes [provider]  chlordiazePOXIDE (LIBRIUM) 25 MG capsule 50mg  PO TID x 1D, then 25-50mg  PO BID X 1D, then 25-50mg  PO QD X 1D Patient not taking: Reported on 06/29/2021 01/11/20   Long, 01/13/20, MD   diphenhydrAMINE (BENADRYL) 25 MG tablet Take 1 tablet (25 mg total) by mouth every 6 (six) hours as needed. Patient not taking: Reported on 06/29/2021 01/12/20   01/14/20, PA-C  famotidine (PEPCID) 20 MG tablet Take 1 tablet (20 mg total) by mouth 2 (two) times daily. Patient not taking: Reported on 06/29/2021 07/27/19   14/2/20, PA-C  sucralfate (CARAFATE) 1 GM/10ML suspension Take 10 mLs (1 g total) by mouth 4 (four) times daily -  with meals and at bedtime. Patient not taking: Reported on 06/29/2021 07/27/19   14/2/20, PA-C     Critical care time: 40 minutes
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
## 2021-07-02 NOTE — Progress Notes (Signed)
eLink Physician-Brief Progress Note Patient Name: Jason Carney DOB: 12-Apr-1996 MRN: [id number]   Date of Service  07/02/2021  HPI/Events of Note  Patient with improved SBP ( < 150) but DBP > 100 mmHg.  eICU Interventions  Hydralazine dose range expanded to optimize control of systolic and diastolic blood pressure, Norvasc 5 mg po daily added for baseline BP control.        Jason Carney 07/02/2021, 5:01 AM

## 2021-07-03 DIAGNOSIS — R7401 Elevation of levels of liver transaminase levels: Secondary | ICD-10-CM

## 2021-07-03 LAB — CBC
HCT: 34.6 % — ABNORMAL LOW (ref 39.0–52.0)
Hemoglobin: 11.8 g/dL — ABNORMAL LOW (ref 13.0–17.0)
MCH: 32.2 pg (ref 26.0–34.0)
MCHC: 34.1 g/dL (ref 30.0–36.0)
MCV: 94.3 fL (ref 80.0–100.0)
Platelets: 158 10*3/uL (ref 150–400)
RBC: 3.67 MIL/uL — ABNORMAL LOW (ref 4.22–5.81)
RDW: 11.4 % — ABNORMAL LOW (ref 11.5–15.5)
WBC: 3.5 10*3/uL — ABNORMAL LOW (ref 4.0–10.5)
nRBC: 0 % (ref 0.0–0.2)

## 2021-07-03 LAB — COMPREHENSIVE METABOLIC PANEL
ALT: 45 U/L — ABNORMAL HIGH (ref 0–44)
AST: 72 U/L — ABNORMAL HIGH (ref 15–41)
Albumin: 2.5 g/dL — ABNORMAL LOW (ref 3.5–5.0)
Alkaline Phosphatase: 58 U/L (ref 38–126)
Anion gap: 10 (ref 5–15)
BUN: <5 mg/dL — ABNORMAL LOW (ref 6–20)
CO2: 20 mmol/L — ABNORMAL LOW (ref 22–32)
Calcium: 8.7 mg/dL — ABNORMAL LOW (ref 8.9–10.3)
Chloride: 106 mmol/L (ref 98–111)
Creatinine, Ser: 0.64 mg/dL (ref 0.61–1.24)
GFR, Estimated: >60 mL/min (ref >60)
Glucose, Bld: 137 mg/dL — ABNORMAL HIGH (ref 70–99)
Potassium: 3.9 mmol/L (ref 3.5–5.1)
Sodium: 136 mmol/L (ref 135–145)
Total Bilirubin: 1.4 mg/dL — ABNORMAL HIGH (ref 0.3–1.2)
Total Protein: 6.3 g/dL — ABNORMAL LOW (ref 6.5–8.1)

## 2021-07-03 MED ORDER — CHLORDIAZEPOXIDE HCL 25 MG PO CAPS: 25.0000 mg | ORAL_CAPSULE | Freq: Once | ORAL | Status: DC

## 2021-07-03 MED ORDER — MAGNESIUM SULFATE 2 GM/50ML IV SOLN
2.0000 g | Freq: Once | INTRAVENOUS | Status: AC
  Administered 2021-07-03: 2 g via INTRAVENOUS
  Filled 2021-07-03: qty 50

## 2021-07-03 MED ORDER — CHLORDIAZEPOXIDE HCL 25 MG PO CAPS: 25.0000 mg | ORAL_CAPSULE | Freq: Two times a day (BID) | ORAL | Status: DC

## 2021-07-03 MED ORDER — CHLORDIAZEPOXIDE HCL 25 MG PO CAPS
25.0000 mg | ORAL_CAPSULE | Freq: Three times a day (TID) | ORAL | Status: DC
  Administered 2021-07-04 (×2): 25 mg via ORAL
  Filled 2021-07-03 (×2): qty 1

## 2021-07-03 NOTE — Consult Note (Signed)
[facility] Face-to-Face Psychiatry Consult   Reason for Consult:  Psychosis. Possible component of EtoH withdrawal. Help to clarify if presentation is typical psychosis. Referring Physician:  Neva Seat, MD Patient Identification: Jason Carney MRN:  [id number] Principal Diagnosis: Alcohol withdrawal [facility]) Diagnosis:  Principal Problem:   Alcohol withdrawal ([place]) Active Problems:   Thrombocytopenia (H[facility])   Elevated blood pressure reading   Abnormal liver enzymes   Alcohol use disorder, severe, dependence ([place])   History of seizure due to alcohol withdrawal   Hallucinations   Psychosis ([place])  Assessment  Jason Carney is a 26 y.o. male admitted medically  06/27/2021  7:45 PM for AMS.Patient no known previous psychiatric dx.  and has a past medical history of significant alcohol use, thrombocytopenia, LFT elevation, seizure  .Psychiatry was consulted for evaluation of underlying functional psychosis vs EtoH withdrawal.   He meets criteria for SUD, EtoH based on labs and collateral from family.  On initial examination, patient appears confused and hallucination. We plan to assist in patient treatment for withdrawal .    Patient displayed some improvement in insight and judgement. Patient did not mention folklore as the reason behind his illness today and did not mention any folklore at any point during his assessment. Patient continues to be verbose in his responses; however they appeared more logical today. Patient also appeared to not recall his hallucinations or delusions and made no mention of any of his previous one's on assessment today.    EtOh withdrawal vs Psychosis, unspecified - Continue EtoH detox per ICU team, Precedex has been stopped today, 11/9 - Continue Zyprexa 5mg  BID - Continue Thiamine 200mg  Daily - SW consult for rehab resources   Flu - Per primary   Safety  At this time patient appears to be of low risk of danger to himself based on assessment.  Recommend routine  observation per floor.   Dispo - Per primary at this time    Total Time spent with patient: 20 minutes  Subjective:   Jason Carney is a 25 y.o. male patient admitted with AMS and transaminitis.  Marland Kitchen  HPI:  Spoke with patient using translator.  Patient is alert and oriented to person, place and time.  Patient reported that he did recall being told that he needed to stop drinking. Patient reported that he did not recall why this was important. Patient and provider discussed the negative effects that drinking has had on his body including the likely etiology behind his seizures. Patient endorsed that he would consider at least decreasing his intake and provided a long anecdote, about how it would be very unlikely that he could just stop overnight. When psychiatric service offered the idea of having resources for rehab or substance use, patient endorsed that he would be interested. Patient denied SI, HI and AVH. Patient reported that he did not recall his previous hallucinations while in the hospital and di not recall why his family brought him to the hospital, but only knew that they did.     Past Medical History:  Past Medical History:  Diagnosis Date   Known health problems: none     Past Surgical History:  Procedure Laterality Date   NO PAST SURGERIES     Family History:  Family History  Problem Relation Age of Onset   Hypertension Neg Hx    Diabetes Neg Hx    Cancer Neg Hx    Heart disease Neg Hx    Social History:  Social History   Substance and  Sexual Activity  Alcohol Use Yes     Social History   Substance and Sexual Activity  Drug Use No    Social History   Socioeconomic History   Marital status: Single    Spouse name: Not on file   Number of children: Not on file   Years of education: Not on file   Highest education level: Not on file  Occupational History   Not on file  Tobacco Use   Smoking status: Never   Smokeless tobacco: Never  Vaping Use   Vaping  Use: Never used  Substance and Sexual Activity   Alcohol use: Yes   Drug use: No   Sexual activity: Not on file  Other Topics Concern   Not on file  Social History Narrative   Not on file   Social Determinants of Health   Financial Resource Strain: Not on file  Food Insecurity: Not on file  Transportation Needs: Not on file  Physical Activity: Not on file  Stress: Not on file  Social Connections: Not on file   Additional Social History:    Allergies:  No Known Allergies  Labs:  Results for orders placed or performed during the hospital encounter of 06/27/21 (from the past 48 hour(s))  Glucose, capillary     Status: None   Collection Time: 07/01/21  4:50 PM  Result Value Ref Range   Glucose-Capillary 80 70 - 99 mg/dL    Comment: Glucose reference range applies only to samples taken after fasting for at least 8 hours.  Glucose, capillary     Status: None   Collection Time: 07/01/21  8:02 PM  Result Value Ref Range   Glucose-Capillary 97 70 - 99 mg/dL    Comment: Glucose reference range applies only to samples taken after fasting for at least 8 hours.  Glucose, capillary     Status: Abnormal   Collection Time: 07/01/21 11:33 PM  Result Value Ref Range   Glucose-Capillary 128 (H) 70 - 99 mg/dL    Comment: Glucose reference range applies only to samples taken after fasting for at least 8 hours.  Glucose, capillary     Status: Abnormal   Collection Time: 07/02/21  3:03 AM  Result Value Ref Range   Glucose-Capillary 125 (H) 70 - 99 mg/dL    Comment: Glucose reference range applies only to samples taken after fasting for at least 8 hours.  CBC     Status: Abnormal   Collection Time: 07/02/21  4:06 AM  Result Value Ref Range   WBC 4.2 4.0 - 10.5 K/uL   RBC 3.62 (L) 4.22 - 5.81 MIL/uL   Hemoglobin 11.8 (L) 13.0 - 17.0 g/dL   HCT 34.0 (L) 39.0 - 52.0 %   MCV 93.9 80.0 - 100.0 fL   MCH 32.6 26.0 - 34.0 pg   MCHC 34.7 30.0 - 36.0 g/dL   RDW 11.6 11.5 - 15.5 %   Platelets  114 (L) 150 - 400 K/uL    Comment: Immature Platelet Fraction may be clinically indicated, consider ordering this additional test [id number] CONSISTENT WITH PREVIOUS RESULT REPEATED TO VERIFY    nRBC 0.0 0.0 - 0.2 %    Comment: Performed at [facility], [place] [address] [place], [place] [address]  Comprehensive metabolic panel     Status: Abnormal   Collection Time: 07/02/21  4:06 AM  Result Value Ref Range   Sodium 135 135 - 145 mmol/L   Potassium 3.9 3.5 - 5.1 mmol/L  Chloride 107 98 - 111 mmol/L   CO2 19 (L) 22 - 32 mmol/L   Glucose, Bld 110 (H) 70 - 99 mg/dL    Comment: Glucose reference range applies only to samples taken after fasting for at least 8 hours.   BUN <5 (L) 6 - 20 mg/dL   Creatinine, Ser 6.71 (L) 0.61 - 1.24 mg/dL   Calcium 8.9 8.9 - 24.5 mg/dL   Total Protein 6.5 6.5 - 8.1 g/dL   Albumin 2.6 (L) 3.5 - 5.0 g/dL   AST 809 (H) 15 - 41 U/L   ALT 52 (H) 0 - 44 U/L   Alkaline Phosphatase 58 38 - 126 U/L   Total Bilirubin 1.1 0.3 - 1.2 mg/dL   GFR, Estimated >98 >33 mL/min    Comment: (NOTE) Calculated using the CKD-EPI Creatinine Equation (2021)    Anion gap 9 5 - 15    Comment: Performed at [facility], [address], [place], [place] [address]  Magnesium     Status: None   Collection Time: 07/02/21  4:06 AM  Result Value Ref Range   Magnesium 1.7 1.7 - 2.4 mg/dL    Comment: Performed at [facility], [address], [place], [place] [address]  Phosphorus     Status: None   Collection Time: 07/02/21  4:06 AM  Result Value Ref Range   Phosphorus 4.4 2.5 - 4.6 mg/dL    Comment: Performed at [facility], [address], [place], [place] [address]  Glucose, capillary     Status: None   Collection Time: 07/02/21  7:32 AM  Result Value Ref Range   Glucose-Capillary 82 70 - 99 mg/dL    Comment: Glucose reference range applies only to samples taken after fasting for at least 8 hours.   Comment 1 Notify RN   CBC     Status: Abnormal    Collection Time: 07/03/21  8:43 AM  Result Value Ref Range   WBC 3.5 (L) 4.0 - 10.5 K/uL   RBC 3.67 (L) 4.22 - 5.81 MIL/uL   Hemoglobin 11.8 (L) 13.0 - 17.0 g/dL   HCT 79.0 (L) 24.0 - 97.3 %   MCV 94.3 80.0 - 100.0 fL   MCH 32.2 26.0 - 34.0 pg   MCHC 34.1 30.0 - 36.0 g/dL   RDW 53.2 (L) 99.2 - 42.6 %   Platelets 158 150 - 400 K/uL   nRBC 0.0 0.0 - 0.2 %    Comment: Performed at [facility], [address], [place], [place] [address]  Comprehensive metabolic panel     Status: Abnormal   Collection Time: 07/03/21  8:43 AM  Result Value Ref Range   Sodium 136 135 - 145 mmol/L   Potassium 3.9 3.5 - 5.1 mmol/L   Chloride 106 98 - 111 mmol/L   CO2 20 (L) 22 - 32 mmol/L   Glucose, Bld 137 (H) 70 - 99 mg/dL    Comment: Glucose reference range applies only to samples taken after fasting for at least 8 hours.   BUN <5 (L) 6 - 20 mg/dL   Creatinine, Ser 6.22 0.61 - 1.24 mg/dL   Calcium 8.7 (L) 8.9 - 10.3 mg/dL   Total Protein 6.3 (L) 6.5 - 8.1 g/dL   Albumin 2.5 (L) 3.5 - 5.0 g/dL   AST 72 (H) 15 - 41 U/L   ALT 45 (H) 0 - 44 U/L   Alkaline Phosphatase 58 38 - 126 U/L  Total Bilirubin 1.4 (H) 0.3 - 1.2 mg/dL   GFR, Estimated >60 >60 mL/min    Comment: (NOTE) Calculated using the CKD-EPI Creatinine Equation (2021)    Anion gap 10 5 - 15    Comment: Performed at [facility] [address], [place], [place] [address]    Current Facility-Administered Medications  Medication Dose Route Frequency Provider Last Rate Last Admin   0.9 %  sodium chloride infusion   Intravenous PRN Margaretha Seeds, MD   Stopped at 07/02/21 1016   acetaminophen (TYLENOL) tablet 650 mg  650 mg Oral Q6H PRN Erick Colace, NP   650 mg at 06/30/21 2057   Or   acetaminophen (TYLENOL) suppository 650 mg  650 mg Rectal Q6H PRN Erick Colace, NP       amLODipine (NORVASC) tablet 5 mg  5 mg Oral Daily Frederik Pear, MD   5 mg at 07/03/21 0908   [START ON 07/04/2021] chlordiazePOXIDE (LIBRIUM)  capsule 25 mg  25 mg Oral TID Audria Nine, DO       Followed by   Derrill Memo ON 07/05/2021] chlordiazePOXIDE (LIBRIUM) capsule 25 mg  25 mg Oral BID Audria Nine, DO       Followed by   Derrill Memo ON 07/06/2021] chlordiazePOXIDE (LIBRIUM) capsule 25 mg  25 mg Oral Once Audria Nine, DO       chlordiazePOXIDE (LIBRIUM) capsule 50 mg  50 mg Oral TID Audria Nine, DO   50 mg at 07/03/21 0908   Chlorhexidine Gluconate Cloth 2 % PADS 6 each  6 each Topical Daily Margaretha Seeds, MD   6 each at 07/03/21 1200   enoxaparin (LOVENOX) injection 40 mg  40 mg Subcutaneous Q24H Audria Nine, DO   40 mg at [id number]   folic acid (FOLVITE) tablet 1 mg  1 mg Oral Daily Erick Colace, NP   1 mg at 07/03/21 [id number]   hydrALAZINE (APRESOLINE) injection 10-30 mg  10-30 mg Intravenous Q4H PRN Frederik Pear, MD   10 mg at 07/02/21 0650   OLANZapine zydis (ZYPREXA) disintegrating tablet 5 mg  5 mg Oral BID Erick Colace, NP   5 mg at 07/03/21 [id number]   oseltamivir (TAMIFLU) capsule 75 mg  75 mg Oral BID Erick Colace, NP   75 mg at 07/03/21 0908   sodium chloride flush (NS) 0.9 % injection 3 mL  3 mL Intravenous Q12H Erick Colace, NP   3 mL at 07/03/21 [id number]   thiamine (B-1) 200 mg in sodium chloride 0.9 % 50 mL IVPB  200 mg Intravenous TID Erick Colace, NP   Stopped at 07/03/21 [id number]   Followed by   Derrill Memo ON 07/07/2021] thiamine tablet 100 mg  100 mg Oral Daily Erick Colace, NP         Psychiatric Specialty Exam:  Presentation  General Appearance: Appropriate for Environment  Eye Contact:Good  Speech:Clear and Coherent  Speech Volume:Normal  Handedness:No data recorded  Mood and Affect  Mood:Euthymic  Affect:Flat   Thought Process  Thought Processes:Goal Directed  Descriptions of Associations:Circumstantial  Orientation:Full (Time, Place and Person)  Thought Content:Logical  History of Schizophrenia/Schizoaffective disorder:No  Duration of  Psychotic Symptoms:Greater than six months  Hallucinations:Hallucinations: None  Ideas of Reference:None  Suicidal Thoughts:Suicidal Thoughts: No  Homicidal Thoughts:Homicidal Thoughts: No   Sensorium  Memory:Immediate Good; Recent Fair; Remote Good  Judgment:-- (Improving)  Insight:Shallow   Executive Functions  Concentration:Good  Attention Span:Good  Recall:Poor  Fund of Knowledge:Fair  Language:Good   Psychomotor Activity  Psychomotor Activity:Psychomotor Activity: Tremor   Assets  Assets:Resilience; Social Support   Sleep  Sleep:Sleep: Fair   Physical Exam: Physical Exam HENT:     Head: Normocephalic.  Pulmonary:     Effort: Pulmonary effort is normal.  Skin:    General: Skin is dry.  Neurological:     Mental Status: He is alert and oriented to person, place, and time.   Review of Systems  Psychiatric/Behavioral:  Negative for hallucinations and suicidal ideas.   Blood pressure (!) 135/99, pulse 88, temperature 98.1 F (36.7 C), temperature source Oral, resp. rate (!) 28, height 5\' 7"  (1.702 m), weight 67.4 kg, SpO2 99 %. Body mass index is 23.27 kg/m.  PGY-2 Freida Busman, MD 07/03/2021 12:46 PM

## 2021-07-03 NOTE — Progress Notes (Signed)
NAMEKennard Carney, MRN:  [id number], DOB:  06/26/1996, LOS: 3 ADMISSION DATE:  06/27/2021, CONSULTATION DATE:  07/01/2021 REFERRING MD:  Tyson Babinski, CHIEF COMPLAINT:  Agitated delirium   History of Present Illness:  Jason Carney is a 25 y.o. M with history of alcohol abuse who presented to [facility] ED for involuntary commitment. He was evaluated at behavioral health urgent care where he was exhibiting paranoid and delusional thoughts, stating that he felt that his brother and a monkey were coming to steal his legs. He was having hallucinations and seeing moving objects that in reality were not moving. At that time he was transported to ED for further care.   In the ED the patient was quite agitated and aggressive. CT head was done which was unremarkable however MRI was truncated due to patient agitation. Patient's family was contacted and they informed staff that he does not have history of psychiatric illness however he did have a seizure 10/31. They endorse alcohol abuse with last drink believed to be 11/03.    Initial evaluation was significant for fever (resolved), tachycardia, agitation, diaphoresis, hallucinations, transaminitis, flu positive, negative UDS and ethanol level, negative hepatitis panel. CIWA protocol with ativan was initiated and he also was initiated on Zyprexa.    He was initially admitted to the floor where he had agitation refractory to verbal redirection and multiple doses of ativan. He was given haldol. He was also found to have elevated CK levels at which time he was initiated on IV half-normal saline with KCl. CK levels did come down.    Psychiatry was consulted and evaluated the patient on 11/07. Patient continued to be difficult to redirect, endorse history of paranoia, and possibly delirious. They recommended continuing CIWA with ativan, Zyprexa, and thiamine, in addition to adding scheduled ativan 1 mg TID. They also recommended continuing to have a 1:1 sitter.    He had  persistent agitation, confusion, and disorientation throughout the day 11/07 despite having received large quantities of ativan. The decision was made to hold Haldol due to it's seizure threshold lowering property and instead initiate Precedex ggt. He was transferred to ICU at that time.   Pertinent  Medical History  None reported  Significant Hospital Events: Including procedures, antibiotic start and stop dates in addition to other pertinent events   11/08 Transferred to ICU and initiated on Precedex ggt  Interim History / Subjective:  Patient is awake and sitting upright in bed this morning, appearing much more alert than yesterday. He states that he feels well, nothing is bothering him at this time. He feels like his mind is clear and feels strong.  Objective   Blood pressure 131/90, pulse 81, temperature 98.3 F (36.8 C), temperature source Oral, resp. rate 19, height 5\' 7"  (1.702 m), weight 67.4 kg, SpO2 98 %.        Intake/Output Summary (Last 24 hours) at 07/03/2021 0827 Last data filed at 07/03/2021 0600 Gross per 24 hour  Intake 2709.01 ml  Output 2550 ml  Net 159.01 ml   Filed Weights   07/01/21 1653 07/03/21 0500  Weight: 67.4 kg 67.4 kg    Examination: Constitutional: No acute distress. Enjoying milk and coffee in bed. Cardio: Regular rate and rhythm. No murmurs, rubs, gallops. Pulm: Clear to auscultation bilaterally. Normal work of breathing on room air. Abdomen: Soft, non-tender, non-distended. MSK: No LE edema. Skin: Skin is warm and dry.  Neuro: Alert and oriented x3. No focal deficit noted. Psych: Normal mood and affect.  Resolved Hospital Problem list   Hypokalemia  Assessment & Plan:  Agitation secondary to alcohol withdrawal History of seizure Previously unable to manage with benzodiazepines resulting in ICU transfer for management with Precedex. He is now off Precedex. - Librium taper - Encourage PO intake - Telemetry - Seizure precautions    Transaminitis LFTs improving. - Trend CMP   Thrombocytopenia Platelet count improving.  - Will start Lovenox for DVT prophylaxis - Trend CBC  Best Practice (right click and "Reselect all SmartList Selections" daily)   Diet/type: Regular consistency (see orders) DVT prophylaxis: LMWH GI prophylaxis: N/A Lines: N/A Foley:  N/A Code Status:  full code Last date of multidisciplinary goals of care discussion [ ]   Labs   CBC: Recent Labs  Lab 06/27/21 2005 06/29/21 2340 06/30/21 0650 07/01/21 0335 07/02/21 0406  WBC 7.6 4.9 6.6 8.3 4.2  NEUTROABS 4.9  --   --   --   --   HGB 13.9 12.6* 12.2* 12.2* 11.8*  HCT 39.1 36.5* 34.8* 36.7* 34.0*  MCV 90.7 92.9 93.0 94.3 93.9  PLT 72* 55* 58* 85* 114*    Basic Metabolic Panel: Recent Labs  Lab 06/27/21 2005 06/29/21 2245 06/30/21 0650 07/01/21 0335 07/02/21 0406  NA 130* 135 132* 133* 135  K 4.5 4.5 3.2* 3.9 3.9  CL 96* 102 102 103 107  CO2 21* 22 20* 21* 19*  GLUCOSE 111* 99 79 98 110*  BUN <5* 5* 5* <5* <5*  CREATININE 0.89 0.69 0.68 0.67 0.50*  CALCIUM 9.5 9.2 8.8* 8.6* 8.9  MG  --  1.6*  --  1.7 1.7  PHOS  --  3.6  --  3.0 4.4   GFR: Estimated Creatinine Clearance: 130.8 mL/min (A) (by C-G formula based on SCr of 0.5 mg/dL (L)). Recent Labs  Lab 06/29/21 2055 06/29/21 2340 06/30/21 0650 07/01/21 0335 07/02/21 0406  WBC  --  4.9 6.6 8.3 4.2  LATICACIDVEN 1.7  --   --   --   --     Liver Function Tests: Recent Labs  Lab 06/27/21 2005 06/29/21 2245 06/30/21 0650 07/01/21 0335 07/02/21 0406  AST 1,201* 476* 348* 200* 106*  ALT 177* 106* 88* 69* 52*  ALKPHOS 74 56 51 55 58  BILITOT 2.3* 1.8* 1.7* 2.0* 1.1  PROT 8.4* 7.2 6.9 6.9 6.5  ALBUMIN 3.7 3.1* 2.9* 2.8* 2.6*   No results for input(s): LIPASE, AMYLASE in the last 168 hours. Recent Labs  Lab 06/29/21 2055  AMMONIA 60*    ABG    Component Value Date/Time   HCO3 24.8 01/11/2020 1837   TCO2 26 01/11/2020 1837   O2SAT 81.0 01/11/2020  1837     Coagulation Profile: Recent Labs  Lab 06/29/21 1616  INR 1.1    Cardiac Enzymes: Recent Labs  Lab 06/29/21 2245 07/01/21 0335  CKTOTAL 8,229* 2,314*    HbA1C: No results found for: HGBA1C  CBG: Recent Labs  Lab 07/01/21 1650 07/01/21 2002 07/01/21 2333 07/02/21 0303 07/02/21 0732  GLUCAP 80 97 128* 125* 82    Review of Systems:   Review of Systems  Constitutional:  Negative for chills.  Respiratory:  Positive for cough. Negative for shortness of breath.   Cardiovascular:  Negative for chest pain.  Gastrointestinal:  Negative for abdominal pain, constipation and diarrhea.  Genitourinary:  Negative for dysuria.  Neurological:  Negative for weakness.  Psychiatric/Behavioral:  Negative for depression and hallucinations. The patient is not nervous/anxious.     Past Medical History:  He,  has a past medical history of Known health problems: none.   Surgical History:   Past Surgical History:  Procedure Laterality Date   NO PAST SURGERIES       Social History:   reports that he has never smoked. He has never used smokeless tobacco. He reports current alcohol use. He reports that he does not use drugs.   Family History:  His family history is negative for Hypertension, Diabetes, Cancer, and Heart disease.   Allergies No Known Allergies   Home Medications  Prior to Admission medications   Medication Sig Start Date End Date Taking? Authorizing Provider  acetaminophen (TYLENOL) 500 MG tablet Take 1,000 mg by mouth every 6 (six) hours as needed for moderate pain or headache.   Yes [provider]  chlordiazePOXIDE (LIBRIUM) 25 MG capsule 50mg  PO TID x 1D, then 25-50mg  PO BID X 1D, then 25-50mg  PO QD X 1D Patient not taking: Reported on 06/29/2021 01/11/20   Long, 01/13/20, MD  diphenhydrAMINE (BENADRYL) 25 MG tablet Take 1 tablet (25 mg total) by mouth every 6 (six) hours as needed. Patient not taking: Reported on 06/29/2021 01/12/20   01/14/20, PA-C  famotidine (PEPCID) 20 MG tablet Take 1 tablet (20 mg total) by mouth 2 (two) times daily. Patient not taking: Reported on 06/29/2021 07/27/19   14/2/20, PA-C  sucralfate (CARAFATE) 1 GM/10ML suspension Take 10 mLs (1 g total) by mouth 4 (four) times daily -  with meals and at bedtime. Patient not taking: Reported on 06/29/2021 07/27/19   14/2/20, PA-C     Critical care time: 30 minutes

## 2021-07-04 DIAGNOSIS — R7401 Elevation of levels of liver transaminase levels: Secondary | ICD-10-CM

## 2021-07-04 DIAGNOSIS — F10931 Alcohol use, unspecified with withdrawal delirium: Secondary | ICD-10-CM

## 2021-07-04 MED ORDER — CHLORDIAZEPOXIDE HCL 25 MG PO CAPS: 25.0000 mg | ORAL_CAPSULE | Freq: Once | ORAL | 0 refills | Status: AC

## 2021-07-04 MED ORDER — OSELTAMIVIR PHOSPHATE 75 MG PO CAPS: 75.0000 mg | ORAL_CAPSULE | Freq: Two times a day (BID) | ORAL | 0 refills | Status: AC

## 2021-07-04 MED ORDER — CHLORDIAZEPOXIDE HCL 25 MG PO CAPS: 25.0000 mg | ORAL_CAPSULE | Freq: Three times a day (TID) | ORAL | 0 refills | Status: AC

## 2021-07-04 MED ORDER — OLANZAPINE 5 MG PO TBDP: 5.0000 mg | ORAL_TABLET | Freq: Two times a day (BID) | ORAL | 0 refills | Status: AC

## 2021-07-04 MED ORDER — AMLODIPINE BESYLATE 5 MG PO TABS: 5.0000 mg | ORAL_TABLET | Freq: Every day | ORAL | 0 refills | Status: AC

## 2021-07-04 MED ORDER — CHLORDIAZEPOXIDE HCL 25 MG PO CAPS
25.0000 mg | ORAL_CAPSULE | Freq: Two times a day (BID) | ORAL | 0 refills | Status: AC
Start: 2021-07-05 — End: ?

## 2021-07-04 NOTE — Discharge Summary (Signed)
Physician Discharge Summary  Patient ID: Jason Carney MRN: [id number] DOB/AGE: 26/11/1995 26 y.o.  Admit date: 06/27/2021 Discharge date: 07/04/2021  Admission Diagnoses:  Discharge Diagnoses:  Principal Problem:   Alcohol withdrawal (HCC) Active Problems:   Thrombocytopenia (HCC)   Elevated blood pressure reading   Abnormal liver enzymes   Alcohol use disorder, severe, dependence (HCC)   History of seizure due to alcohol withdrawal   Hallucinations   Psychosis (HCC)   Discharged Condition: good OF NOTE I CARED FOR THIS PT FOR <12 hours and he was transferred back to primary service however per institution policy as he is now stable for d/c [facility] has requested I perform a d/c. Please see emr for complete details from [facility] team.   Hospital Course: Jason Carney is a 25 y.o. male with medical history significant of significant alcohol use, thrombocytopenia, LFT elevation, seizure presenting with altered mental status and hallucinations.   History obtained with assistance of chart review given patient's significant agitation and mental status change in addition to language barrier.  Patient reported he has significant alcohol use family states is much more than he is ever admitted to.  There was a previous evaluation in the ED in May where he he appeared to be starting to withdrawal however he declined admission at that time.   Family thinks his last drink was over the weekend or earlier this week sometime.  He could have hallucinations Per chart review he reportedly had a fall and a seizure on Monday.  And he has been having hallucinations starting Thursday where he thought people were trying to hurt him or cut him.  He went to behavioral health for evaluation and they sent to the ED for medical clearance.  There he did have his significant LFT elevation and flu positive with some significant agitation initially being treated for psychosis with consideration of possible alcohol withdrawal  (CIWA with ativan in ED)   Unable to perform full review of systems due to patient's agitation   ED Course: Vital signs in the ED significant for persistent tachycardia in the 100s to 120s for the last couple of days.  Blood pressure ranging from the 120s to 150s systolic.  Otherwise stable.  Did initially have a fever when he came in but this has improved.  Lab work-up initially showed sodium 130, chloride 96, bicarb 21, AST 1200, ALT 177, T bili 2.3.  CBC with platelets 72.  Repeat labs are pending.  Lactic acid normal.  Flu a was positive in the ED.  UDS was negative and ethanol level negative.  Hepatitis panel was performed while in the ED and this was negative.  Chest x-ray and CT head showed no acute normality.  MR brain has been ordered.  PT and INR of also been negative.  Patient received several doses of Ativan as well as a dose of Zyprexa which is now been scheduled and dose of thiamine.   Now with increasing concern for alcohol withdrawal, admission has been requested to support him through potential withdrawal and to rule this out.  Case had been discussed with neurology who noted that DTs can last for multiple days.   Review of Systems: I to be performed at this time.  As per HPI otherwise all other systems reviewed and are negative.    Over the course of the pts hospitalization he was monitored for dt/ he was ultimately transferred for <12 hours to the ICU service for precedex infusion and was transferred back to [facility]  service however psych then cleared the pt and stated the IVC did not require to be renewed but did not cancel it so pt remained in hospital until IVC expired at which time he was discharged to home with recommendation to abstain from etoh and seek inpt rehab.   Consults:  ccm, psych  Significant Diagnostic Studies: see emr  Treatments: see emr  Discharge Exam: Blood pressure 124/89, pulse 79, temperature 98.5 F (36.9 C), temperature source Oral, resp. rate 20, height  5\' 7"  (1.702 m), weight 66.8 kg, SpO2 100 %.   General:  in no acute distress, sitting up in bed requesting to be discharged HENT: NCAT ETT in place, PERRL PULM: CTA B, even resp CV: RRR, no mgr GI: BS+, soft, nontender, nondistended MSK: normal bulk and tone Neuro: conversant, no focal deficits.   Disposition:  home  Allergies as of 07/04/2021   No Known Allergies      Medication List     TAKE these medications    acetaminophen 500 MG tablet Commonly known as: TYLENOL Take 1,000 mg by mouth every 6 (six) hours as needed for moderate pain or headache.   amLODipine 5 MG tablet Commonly known as: NORVASC Take 1 tablet (5 mg total) by mouth daily. Start taking on: July 05, 2021   chlordiazePOXIDE 25 MG capsule Commonly known as: LIBRIUM Take 1 capsule (25 mg total) by mouth 3 (three) times daily. What changed:  how much to take how to take this when to take this additional instructions   chlordiazePOXIDE 25 MG capsule Commonly known as: LIBRIUM Take 1 capsule (25 mg total) by mouth 2 (two) times daily. Start taking on: July 05, 2021 What changed: You were already taking a medication with the same name, and this prescription was added. Make sure you understand how and when to take each.   chlordiazePOXIDE 25 MG capsule Commonly known as: LIBRIUM Take 1 capsule (25 mg total) by mouth once for 1 dose. Start taking on: July 06, 2021 What changed: You were already taking a medication with the same name, and this prescription was added. Make sure you understand how and when to take each.   diphenhydrAMINE 25 MG tablet Commonly known as: BENADRYL Take 1 tablet (25 mg total) by mouth every 6 (six) hours as needed.   famotidine 20 MG tablet Commonly known as: PEPCID Take 1 tablet (20 mg total) by mouth 2 (two) times daily.   OLANZapine zydis 5 MG disintegrating tablet Commonly known as: ZYPREXA Take 1 tablet (5 mg total) by mouth 2 (two) times daily.    oseltamivir 75 MG capsule Commonly known as: TAMIFLU Take 1 capsule (75 mg total) by mouth 2 (two) times daily.   sucralfate 1 GM/10ML suspension Commonly known as: Carafate Take 10 mLs (1 g total) by mouth 4 (four) times daily -  with meals and at bedtime.         Signed: Audria Nine 07/04/2021, 5:48 PM

## 2021-07-04 NOTE — Progress Notes (Signed)
Patient was d/c by previous shift nurse. The writer had only focussed assessment on him. Assessment remained unchanged prior. Patient walked out accompanied by family. D/c instructions given and signed.

## 2021-07-04 NOTE — Plan of Care (Signed)

## 2021-08-24 ENCOUNTER — Emergency Department (HOSPITAL_COMMUNITY)
Admission: EM | Admit: 2021-08-24 | Discharge: 2021-08-24 | Disposition: A | Discharge status: Home / Self Care | Payer: Self-pay | Attending: Emergency Medicine | Admitting: Emergency Medicine

## 2021-08-24 DIAGNOSIS — Z8719 Personal history of other diseases of the digestive system: Secondary | ICD-10-CM

## 2021-08-24 DIAGNOSIS — L299 Pruritus, unspecified: Secondary | ICD-10-CM | POA: Insufficient documentation

## 2021-08-24 MED ORDER — HYDROXYZINE HCL 25 MG PO TABS
25.0000 mg | ORAL_TABLET | Freq: Once | ORAL | Status: AC
  Administered 2021-08-24: 25 mg via ORAL
  Filled 2021-08-24: qty 1

## 2021-08-24 MED ORDER — HYDROXYZINE HCL 25 MG PO TABS: 25.0000 mg | ORAL_TABLET | Freq: Four times a day (QID) | ORAL | 0 refills | Status: AC

## 2021-08-24 NOTE — Discharge Instructions (Addendum)
It was our pleasure to provide your ER care today - we hope that you feel better.  Drink plenty of fluids/stay well hydrated. Avoid any alcohol use.   Take atarax as need for itching - no driving when taking. Avoid any perfumed soaps or detergents. Use gentle skin moisturizer such as aveeno or eucerin.   Follow up with primary care doctor in the coming week for recheck - call office Monday AM to arrange appointment.   Return to ER if worse, new symptoms, fevers, new/severe pain, persistent vomiting, trouble breathing, or other concern.

## 2021-08-24 NOTE — ED Triage Notes (Signed)
EMS stated, He has been itching  for 2 days. No other symptoms

## 2021-08-24 NOTE — ED Provider Notes (Signed)
Emergency Medicine Provider Triage Evaluation Note  Jason Carney , a 25 y.o. male  was evaluated in triage.  Pt complains of itching.  For the past 2 days patient reports he has been having diffuse itching everywhere.  He reports his most severe on his back.  He is noted some bumps on his back but has not seen any rash or skin changes elsewhere.  No associated facial swelling or difficulty breathing.  No other associated symptoms  Review of Systems  Positive: Itching, rash Negative: Fevers, facial swelling, chest pain, shortness of breath, abdominal pain  Physical Exam  BP (!) 138/99 (BP Location: Right Arm)    Pulse 84    Temp 97.6 F (36.4 C) (Oral)    Resp 16    SpO2 99%  Gen:   Awake, no distress   Resp:  Normal effort  MSK:   Moves extremities without difficulty  Other:  Diffuse papular rash to the back, not noted elsewhere, no erythema, vesicles, pustules or petechiae  Medical Decision Making  Medically screening exam initiated at 1:07 PM.  Appropriate orders placed.  Waldemar Willard was informed that the remainder of the evaluation will be completed by another provider, this initial triage assessment does not replace that evaluation, and the importance of remaining in the ED until their evaluation is complete.     Dartha Lodge, PA-C 08/24/21 1313    Linwood Dibbles, MD 08/24/21 [phone]

## 2021-08-24 NOTE — ED Provider Notes (Signed)
[facility]   CSN: [id number] Arrival date & time: 08/24/21  1103     History Chief Complaint  Patient presents with   Pruritis    Jason Carney is a 25 y.o. male.  Patient c/o itching diffusely in past few weeks. Symptoms gradual onset, moderate, diffuse. Has not taken any meds for itching  today. No change in home or personal products. No change in diet. No new meds or change in meds. States other than itching does not feel sick or ill. No fever/chills. No nausea/vomiting. Normal appetite. No rash/skin lesions. No wheezing. No sob.  The history is provided by the patient and medical records.      Past Medical History:  Diagnosis Date   Known health problems: none     Patient Active Problem List   Diagnosis Date Noted   Hallucinations    Psychosis (HCC)    Alcohol withdrawal (HCC) 06/29/2021   History of seizure due to alcohol withdrawal 06/29/2021   Alcohol use disorder, severe, dependence (HCC)    Alcohol-induced psychosis, with delusions (HCC)    Thrombocytopenia (HCC) 12/20/2018   Elevated blood pressure reading 12/20/2018   Abnormal liver enzymes 12/20/2018    Past Surgical History:  Procedure Laterality Date   NO PAST SURGERIES         Family History  Problem Relation Age of Onset   Hypertension Neg Hx    Diabetes Neg Hx    Cancer Neg Hx    Heart disease Neg Hx     Social History   Tobacco Use   Smoking status: Never   Smokeless tobacco: Never  Vaping Use   Vaping Use: Never used  Substance Use Topics   Alcohol use: Yes   Drug use: No    Home Medications Prior to Admission medications   Medication Sig Start Date End Date Taking? Authorizing Provider  acetaminophen (TYLENOL) 500 MG tablet Take 1,000 mg by mouth every 6 (six) hours as needed for moderate pain or headache.    [provider]  amLODipine (NORVASC) 5 MG tablet Take 1 tablet (5 mg total) by mouth daily. 07/05/21    Briant SitesMarshall, Jessica, DO  chlordiazePOXIDE (LIBRIUM) 25 MG capsule Take 1 capsule (25 mg total) by mouth 3 (three) times daily. 07/04/21   Briant SitesMarshall, Jessica, DO  chlordiazePOXIDE (LIBRIUM) 25 MG capsule Take 1 capsule (25 mg total) by mouth 2 (two) times daily. 07/05/21   Briant SitesMarshall, Jessica, DO  diphenhydrAMINE (BENADRYL) 25 MG tablet Take 1 tablet (25 mg total) by mouth every 6 (six) hours as needed. Patient not taking: Reported on 06/29/2021 01/12/20   Bethel BornGekas, Kelly Marie, PA-C  famotidine (PEPCID) 20 MG tablet Take 1 tablet (20 mg total) by mouth 2 (two) times daily. Patient not taking: Reported on 06/29/2021 07/27/19   Emi HolesLaw, Alexandra M, PA-C  OLANZapine zydis (ZYPREXA) 5 MG disintegrating tablet Take 1 tablet (5 mg total) by mouth 2 (two) times daily. 07/04/21   Briant SitesMarshall, Jessica, DO  oseltamivir (TAMIFLU) 75 MG capsule Take 1 capsule (75 mg total) by mouth 2 (two) times daily. 07/04/21   Briant SitesMarshall, Jessica, DO  sucralfate (CARAFATE) 1 GM/10ML suspension Take 10 mLs (1 g total) by mouth 4 (four) times daily -  with meals and at bedtime. Patient not taking: Reported on 06/29/2021 07/27/19   Emi HolesLaw, Alexandra M, PA-C    Allergies    Patient has no known allergies.  Review of Systems   Review of Systems  Constitutional:  Negative for fever.  HENT:  Negative for sore throat and trouble swallowing.   Eyes:  Negative for redness.  Respiratory:  Negative for cough and shortness of breath.   Cardiovascular:  Negative for chest pain.  Gastrointestinal:  Negative for abdominal pain, diarrhea and vomiting.  Genitourinary:  Negative for dysuria and flank pain.  Musculoskeletal:  Negative for back pain.  Skin:  Negative for rash.  Neurological:  Negative for headaches.  Hematological:  Does not bruise/bleed easily.  Psychiatric/Behavioral:  Negative for confusion.    Physical Exam Updated Vital Signs BP (!) 138/99 (BP Location: Right Arm)    Pulse 84    Temp 97.6 F (36.4 C) (Oral)    Resp 16    SpO2 99%    Physical Exam Vitals and nursing note reviewed.  Constitutional:      Appearance: Normal appearance. He is well-developed.  HENT:     Head: Atraumatic.     Nose: Nose normal.     Mouth/Throat:     Mouth: Mucous membranes are moist.     Pharynx: Oropharynx is clear.  Eyes:     General: No scleral icterus.    Conjunctiva/sclera: Conjunctivae normal.     Pupils: Pupils are equal, round, and reactive to light.  Neck:     Trachea: No tracheal deviation.  Cardiovascular:     Rate and Rhythm: Normal rate and regular rhythm.     Pulses: Normal pulses.     Heart sounds: Normal heart sounds. No murmur heard.   No friction rub. No gallop.  Pulmonary:     Effort: Pulmonary effort is normal. No accessory muscle usage or respiratory distress.     Breath sounds: Normal breath sounds.  Abdominal:     General: Bowel sounds are normal. There is no distension.     Palpations: Abdomen is soft.     Tenderness: There is no abdominal tenderness. There is no guarding.  Genitourinary:    Comments: No cva tenderness. Musculoskeletal:        General: No swelling.     Cervical back: Normal range of motion and neck supple. No rigidity.  Skin:    General: Skin is warm and dry.     Findings: No rash.  Neurological:     Mental Status: He is alert.     Comments: Alert, speech clear. Steady gait.   Psychiatric:        Mood and Affect: Mood normal.    ED Results / Procedures / Treatments   Labs (all labs ordered are listed, but only abnormal results are displayed) Labs Reviewed - No data to display  EKG None  Radiology No results found.  Procedures Procedures   Medications Ordered in ED Medications  hydrOXYzine (ATARAX) tablet 25 mg (has no administration in time range)    ED Course  I have reviewed the triage vital signs and the nursing notes.  Pertinent labs & imaging results that were available during my care of the patient were reviewed by me and considered in my medical  decision making (see chart for details).    MDM Rules/Calculators/A&P                         Reviewed nursing notes and prior charts for additional history.   No meds for itching pta. Atarax po.   No rash noted. Chest cta. Pharynx normal. On review prior charts, hx liver disease, ?whether related/bile salts, etc.   Will give symptomatic  tx today, rec close pcp f/u.  Return precautions provided.    Final Clinical Impression(s) / ED Diagnoses Final diagnoses:  None    Rx / DC Orders ED Discharge Orders     None        Lajean Saver, MD 08/24/21 1351

## 2022-10-17 ENCOUNTER — Inpatient Hospital Stay (HOSPITAL_COMMUNITY)
Admission: EM | Admit: 2022-10-17 | Discharge: 2022-11-24 | DRG: 432 | Disposition: E | Payer: Self-pay | Attending: Internal Medicine | Admitting: Internal Medicine

## 2022-10-17 ENCOUNTER — Emergency Department (HOSPITAL_COMMUNITY): Payer: Self-pay

## 2022-10-17 DIAGNOSIS — I9589 Other hypotension: Secondary | ICD-10-CM

## 2022-10-17 DIAGNOSIS — N179 Acute kidney failure, unspecified: Secondary | ICD-10-CM | POA: Diagnosis not present

## 2022-10-17 DIAGNOSIS — K7682 Hepatic encephalopathy: Secondary | ICD-10-CM | POA: Diagnosis present

## 2022-10-17 DIAGNOSIS — R636 Underweight: Secondary | ICD-10-CM | POA: Diagnosis present

## 2022-10-17 DIAGNOSIS — K7010 Alcoholic hepatitis without ascites: Secondary | ICD-10-CM

## 2022-10-17 DIAGNOSIS — I959 Hypotension, unspecified: Secondary | ICD-10-CM | POA: Diagnosis present

## 2022-10-17 DIAGNOSIS — K920 Hematemesis: Secondary | ICD-10-CM | POA: Diagnosis present

## 2022-10-17 DIAGNOSIS — M7981 Nontraumatic hematoma of soft tissue: Secondary | ICD-10-CM | POA: Diagnosis present

## 2022-10-17 DIAGNOSIS — E874 Mixed disorder of acid-base balance: Secondary | ICD-10-CM | POA: Diagnosis present

## 2022-10-17 DIAGNOSIS — B258 Other cytomegaloviral diseases: Secondary | ICD-10-CM | POA: Diagnosis present

## 2022-10-17 DIAGNOSIS — E870 Hyperosmolality and hypernatremia: Secondary | ICD-10-CM | POA: Diagnosis present

## 2022-10-17 DIAGNOSIS — K767 Hepatorenal syndrome: Secondary | ICD-10-CM | POA: Diagnosis present

## 2022-10-17 DIAGNOSIS — K766 Portal hypertension: Secondary | ICD-10-CM | POA: Diagnosis present

## 2022-10-17 DIAGNOSIS — D684 Acquired coagulation factor deficiency: Secondary | ICD-10-CM

## 2022-10-17 DIAGNOSIS — Z1152 Encounter for screening for COVID-19: Secondary | ICD-10-CM

## 2022-10-17 DIAGNOSIS — R1312 Dysphagia, oropharyngeal phase: Secondary | ICD-10-CM | POA: Diagnosis not present

## 2022-10-17 DIAGNOSIS — K7011 Alcoholic hepatitis with ascites: Secondary | ICD-10-CM | POA: Diagnosis present

## 2022-10-17 DIAGNOSIS — R601 Generalized edema: Secondary | ICD-10-CM | POA: Diagnosis not present

## 2022-10-17 DIAGNOSIS — E7220 Disorder of urea cycle metabolism, unspecified: Secondary | ICD-10-CM | POA: Diagnosis present

## 2022-10-17 DIAGNOSIS — K7031 Alcoholic cirrhosis of liver with ascites: Secondary | ICD-10-CM | POA: Diagnosis present

## 2022-10-17 DIAGNOSIS — R4182 Altered mental status, unspecified: Secondary | ICD-10-CM

## 2022-10-17 DIAGNOSIS — R7881 Bacteremia: Secondary | ICD-10-CM | POA: Diagnosis present

## 2022-10-17 DIAGNOSIS — B967 Clostridium perfringens [C. perfringens] as the cause of diseases classified elsewhere: Secondary | ICD-10-CM | POA: Diagnosis present

## 2022-10-17 DIAGNOSIS — R030 Elevated blood-pressure reading, without diagnosis of hypertension: Secondary | ICD-10-CM

## 2022-10-17 DIAGNOSIS — K7201 Acute and subacute hepatic failure with coma: Secondary | ICD-10-CM

## 2022-10-17 DIAGNOSIS — R569 Unspecified convulsions: Secondary | ICD-10-CM | POA: Diagnosis not present

## 2022-10-17 DIAGNOSIS — D62 Acute posthemorrhagic anemia: Secondary | ICD-10-CM | POA: Diagnosis not present

## 2022-10-17 DIAGNOSIS — Z781 Physical restraint status: Secondary | ICD-10-CM

## 2022-10-17 DIAGNOSIS — R791 Abnormal coagulation profile: Secondary | ICD-10-CM | POA: Diagnosis present

## 2022-10-17 DIAGNOSIS — K769 Liver disease, unspecified: Secondary | ICD-10-CM

## 2022-10-17 DIAGNOSIS — J9601 Acute respiratory failure with hypoxia: Secondary | ICD-10-CM | POA: Diagnosis present

## 2022-10-17 DIAGNOSIS — E871 Hypo-osmolality and hyponatremia: Secondary | ICD-10-CM | POA: Diagnosis not present

## 2022-10-17 DIAGNOSIS — Z681 Body mass index (BMI) 19 or less, adult: Secondary | ICD-10-CM

## 2022-10-17 DIAGNOSIS — D6959 Other secondary thrombocytopenia: Secondary | ICD-10-CM | POA: Diagnosis present

## 2022-10-17 DIAGNOSIS — Z79899 Other long term (current) drug therapy: Secondary | ICD-10-CM

## 2022-10-17 DIAGNOSIS — D638 Anemia in other chronic diseases classified elsewhere: Secondary | ICD-10-CM | POA: Diagnosis present

## 2022-10-17 DIAGNOSIS — F05 Delirium due to known physiological condition: Secondary | ICD-10-CM | POA: Diagnosis not present

## 2022-10-17 DIAGNOSIS — K7200 Acute and subacute hepatic failure without coma: Secondary | ICD-10-CM | POA: Diagnosis present

## 2022-10-17 DIAGNOSIS — E876 Hypokalemia: Secondary | ICD-10-CM | POA: Diagnosis present

## 2022-10-17 DIAGNOSIS — F10139 Alcohol abuse with withdrawal, unspecified: Secondary | ICD-10-CM | POA: Diagnosis not present

## 2022-10-17 DIAGNOSIS — R739 Hyperglycemia, unspecified: Secondary | ICD-10-CM | POA: Diagnosis not present

## 2022-10-17 DIAGNOSIS — Z515 Encounter for palliative care: Secondary | ICD-10-CM

## 2022-10-17 DIAGNOSIS — K7040 Alcoholic hepatic failure without coma: Principal | ICD-10-CM | POA: Diagnosis present

## 2022-10-17 DIAGNOSIS — J690 Pneumonitis due to inhalation of food and vomit: Secondary | ICD-10-CM | POA: Diagnosis not present

## 2022-10-17 DIAGNOSIS — Z597 Insufficient social insurance and welfare support: Secondary | ICD-10-CM

## 2022-10-17 DIAGNOSIS — A044 Other intestinal Escherichia coli infections: Secondary | ICD-10-CM | POA: Diagnosis present

## 2022-10-17 DIAGNOSIS — R5383 Other fatigue: Secondary | ICD-10-CM | POA: Diagnosis not present

## 2022-10-17 DIAGNOSIS — G9341 Metabolic encephalopathy: Secondary | ICD-10-CM | POA: Diagnosis not present

## 2022-10-17 DIAGNOSIS — D696 Thrombocytopenia, unspecified: Secondary | ICD-10-CM

## 2022-10-17 DIAGNOSIS — R5381 Other malaise: Secondary | ICD-10-CM | POA: Diagnosis not present

## 2022-10-17 DIAGNOSIS — R7401 Elevation of levels of liver transaminase levels: Secondary | ICD-10-CM | POA: Diagnosis present

## 2022-10-17 DIAGNOSIS — Z66 Do not resuscitate: Secondary | ICD-10-CM | POA: Diagnosis not present

## 2022-10-17 LAB — LACTATE DEHYDROGENASE: LDH: 396 U/L — ABNORMAL HIGH (ref 98–192)

## 2022-10-17 LAB — RESP PANEL BY RT-PCR (RSV, FLU A&B, COVID)  RVPGX2
Influenza A by PCR: NEGATIVE
Influenza B by PCR: NEGATIVE
Resp Syncytial Virus by PCR: NEGATIVE
SARS Coronavirus 2 by RT PCR: NEGATIVE

## 2022-10-17 LAB — HEPATITIS PANEL, ACUTE
HCV Ab: NONREACTIVE
Hep A IgM: NONREACTIVE
Hep B C IgM: NONREACTIVE
Hepatitis B Surface Ag: NONREACTIVE

## 2022-10-17 LAB — CBC
HCT: 29.6 % — ABNORMAL LOW (ref 39.0–52.0)
Hemoglobin: 11.1 g/dL — ABNORMAL LOW (ref 13.0–17.0)
MCH: 31.9 pg (ref 26.0–34.0)
MCHC: 37.5 g/dL — ABNORMAL HIGH (ref 30.0–36.0)
MCV: 85.1 fL (ref 80.0–100.0)
Platelets: 39 10*3/uL — ABNORMAL LOW (ref 150–400)
RBC: 3.48 MIL/uL — ABNORMAL LOW (ref 4.22–5.81)
RDW: 16.4 % — ABNORMAL HIGH (ref 11.5–15.5)
WBC: 10.2 10*3/uL (ref 4.0–10.5)
nRBC: 0 % (ref 0.0–0.2)

## 2022-10-17 LAB — PROTIME-INR
INR: 5.2 (ref 0.8–1.2)
Prothrombin Time: 47.7 seconds — ABNORMAL HIGH (ref 11.4–15.2)

## 2022-10-17 LAB — LIPASE, BLOOD: Lipase: 170 U/L — ABNORMAL HIGH (ref 11–51)

## 2022-10-17 LAB — PHOSPHORUS: Phosphorus: 3.2 mg/dL (ref 2.5–4.6)

## 2022-10-17 LAB — ACETAMINOPHEN LEVEL: Acetaminophen (Tylenol), Serum: <10 ug/mL — ABNORMAL LOW (ref 10–30)

## 2022-10-17 LAB — AMMONIA: Ammonia: 128 umol/L — ABNORMAL HIGH (ref 9–35)

## 2022-10-17 LAB — MAGNESIUM: Magnesium: 1.2 mg/dL — ABNORMAL LOW (ref 1.7–2.4)

## 2022-10-17 LAB — ETHANOL: Alcohol, Ethyl (B): <10 mg/dL (ref <10)

## 2022-10-17 LAB — TROPONIN I (HIGH SENSITIVITY): Troponin I (High Sensitivity): 20 ng/L — ABNORMAL HIGH (ref <18)

## 2022-10-17 LAB — FIBRINOGEN: Fibrinogen: 133 mg/dL — ABNORMAL LOW (ref 210–475)

## 2022-10-17 LAB — SALICYLATE LEVEL: Salicylate Lvl: 15.5 mg/dL (ref 7.0–30.0)

## 2022-10-17 MED ORDER — ACETYLCYSTEINE LOAD VIA INFUSION: 150.0000 mg/kg | Freq: Once | INTRAVENOUS | Status: DC

## 2022-10-17 MED ORDER — LACTATED RINGERS IV BOLUS
1000.0000 mL | Freq: Once | INTRAVENOUS | Status: AC
  Administered 2022-10-18: 1000 mL via INTRAVENOUS

## 2022-10-17 MED ORDER — ONDANSETRON HCL 4 MG/2ML IJ SOLN
4.0000 mg | Freq: Four times a day (QID) | INTRAMUSCULAR | Status: DC | PRN
  Administered 2022-10-26: 4 mg via INTRAVENOUS
  Filled 2022-10-17 (×2): qty 2

## 2022-10-17 MED ORDER — POTASSIUM CHLORIDE 10 MEQ/100ML IV SOLN
10.0000 meq | INTRAVENOUS | Status: AC
  Administered 2022-10-18 (×2): 10 meq via INTRAVENOUS
  Filled 2022-10-17 (×2): qty 100

## 2022-10-17 MED ORDER — SODIUM CHLORIDE 0.9 % IV BOLUS
1000.0000 mL | Freq: Once | INTRAVENOUS | Status: AC
  Administered 2022-10-17: 1000 mL via INTRAVENOUS

## 2022-10-17 MED ORDER — PANTOPRAZOLE SODIUM 40 MG IV SOLR
40.0000 mg | INTRAVENOUS | Status: DC
  Administered 2022-10-18: 40 mg via INTRAVENOUS
  Filled 2022-10-17: qty 10

## 2022-10-17 MED ORDER — LACTULOSE ENEMA
300.0000 mL | Freq: Once | ORAL | Status: AC
  Administered 2022-10-18: 300 mL via RECTAL
  Filled 2022-10-17: qty 300

## 2022-10-17 MED ORDER — LACTULOSE 10 GM/15ML PO SOLN
30.0000 g | Freq: Once | ORAL | Status: AC
  Administered 2022-10-17: 30 g via ORAL
  Filled 2022-10-17: qty 45

## 2022-10-18 ENCOUNTER — Inpatient Hospital Stay (HOSPITAL_COMMUNITY): Payer: Self-pay

## 2022-10-18 ENCOUNTER — Encounter (HOSPITAL_COMMUNITY): Payer: Self-pay

## 2022-10-18 DIAGNOSIS — J9601 Acute respiratory failure with hypoxia: Secondary | ICD-10-CM

## 2022-10-18 DIAGNOSIS — D689 Coagulation defect, unspecified: Secondary | ICD-10-CM

## 2022-10-18 DIAGNOSIS — K7682 Hepatic encephalopathy: Secondary | ICD-10-CM

## 2022-10-18 DIAGNOSIS — R748 Abnormal levels of other serum enzymes: Secondary | ICD-10-CM

## 2022-10-18 DIAGNOSIS — K7460 Unspecified cirrhosis of liver: Secondary | ICD-10-CM

## 2022-10-18 DIAGNOSIS — I959 Hypotension, unspecified: Secondary | ICD-10-CM

## 2022-10-18 DIAGNOSIS — R4182 Altered mental status, unspecified: Secondary | ICD-10-CM

## 2022-10-18 DIAGNOSIS — K766 Portal hypertension: Secondary | ICD-10-CM

## 2022-10-18 LAB — IRON AND TIBC: Iron: 107 ug/dL (ref 45–182)

## 2022-10-18 LAB — COMPREHENSIVE METABOLIC PANEL
ALT: 114 U/L — ABNORMAL HIGH (ref 0–44)
ALT: 92 U/L — ABNORMAL HIGH (ref 0–44)
ALT: 80 U/L — ABNORMAL HIGH (ref 0–44)
AST: 443 U/L — ABNORMAL HIGH (ref 15–41)
AST: 334 U/L — ABNORMAL HIGH (ref 15–41)
AST: 297 U/L — ABNORMAL HIGH (ref 15–41)
Albumin: 1.6 g/dL — ABNORMAL LOW (ref 3.5–5.0)
Albumin: <1.5 g/dL — ABNORMAL LOW (ref 3.5–5.0)
Albumin: <1.5 g/dL — ABNORMAL LOW (ref 3.5–5.0)
Alkaline Phosphatase: 188 U/L — ABNORMAL HIGH (ref 38–126)
Alkaline Phosphatase: 124 U/L (ref 38–126)
Alkaline Phosphatase: 89 U/L (ref 38–126)
Anion gap: 15 (ref 5–15)
Anion gap: 16 — ABNORMAL HIGH (ref 5–15)
Anion gap: 16 — ABNORMAL HIGH (ref 5–15)
BUN: 6 mg/dL (ref 6–20)
BUN: 7 mg/dL (ref 6–20)
BUN: 8 mg/dL (ref 6–20)
CO2: 22 mmol/L (ref 22–32)
CO2: 19 mmol/L — ABNORMAL LOW (ref 22–32)
CO2: 17 mmol/L — ABNORMAL LOW (ref 22–32)
Calcium: 8.5 mg/dL — ABNORMAL LOW (ref 8.9–10.3)
Calcium: 7.8 mg/dL — ABNORMAL LOW (ref 8.9–10.3)
Calcium: 7.5 mg/dL — ABNORMAL LOW (ref 8.9–10.3)
Chloride: 87 mmol/L — ABNORMAL LOW (ref 98–111)
Chloride: 90 mmol/L — ABNORMAL LOW (ref 98–111)
Chloride: 94 mmol/L — ABNORMAL LOW (ref 98–111)
Creatinine, Ser: 1.22 mg/dL (ref 0.61–1.24)
Creatinine, Ser: 1.19 mg/dL (ref 0.61–1.24)
Creatinine, Ser: 1.22 mg/dL (ref 0.61–1.24)
GFR, Estimated: >60 mL/min (ref >60)
GFR, Estimated: >60 mL/min (ref >60)
GFR, Estimated: >60 mL/min (ref >60)
Glucose, Bld: 113 mg/dL — ABNORMAL HIGH (ref 70–99)
Glucose, Bld: 145 mg/dL — ABNORMAL HIGH (ref 70–99)
Glucose, Bld: 92 mg/dL (ref 70–99)
Potassium: 2.8 mmol/L — ABNORMAL LOW (ref 3.5–5.1)
Potassium: 2.6 mmol/L — CL (ref 3.5–5.1)
Potassium: 3 mmol/L — ABNORMAL LOW (ref 3.5–5.1)
Sodium: 124 mmol/L — ABNORMAL LOW (ref 135–145)
Sodium: 125 mmol/L — ABNORMAL LOW (ref 135–145)
Sodium: 127 mmol/L — ABNORMAL LOW (ref 135–145)
Total Bilirubin: 18.4 mg/dL (ref 0.3–1.2)
Total Bilirubin: 15.9 mg/dL — ABNORMAL HIGH (ref 0.3–1.2)
Total Bilirubin: 13.7 mg/dL — ABNORMAL HIGH (ref 0.3–1.2)
Total Protein: 7.4 g/dL (ref 6.5–8.1)
Total Protein: 6.2 g/dL — ABNORMAL LOW (ref 6.5–8.1)
Total Protein: 5.6 g/dL — ABNORMAL LOW (ref 6.5–8.1)

## 2022-10-18 LAB — CBC
HCT: 24.9 % — ABNORMAL LOW (ref 39.0–52.0)
HCT: 18.7 % — ABNORMAL LOW (ref 39.0–52.0)
HCT: 17.8 % — ABNORMAL LOW (ref 39.0–52.0)
HCT: 24.6 % — ABNORMAL LOW (ref 39.0–52.0)
Hemoglobin: 9.2 g/dL — ABNORMAL LOW (ref 13.0–17.0)
Hemoglobin: 7 g/dL — ABNORMAL LOW (ref 13.0–17.0)
Hemoglobin: 6.6 g/dL — CL (ref 13.0–17.0)
Hemoglobin: 9 g/dL — ABNORMAL LOW (ref 13.0–17.0)
MCH: 31.5 pg (ref 26.0–34.0)
MCH: 31.8 pg (ref 26.0–34.0)
MCH: 31.9 pg (ref 26.0–34.0)
MCH: 31.7 pg (ref 26.0–34.0)
MCHC: 36.9 g/dL — ABNORMAL HIGH (ref 30.0–36.0)
MCHC: 37.4 g/dL — ABNORMAL HIGH (ref 30.0–36.0)
MCHC: 37.1 g/dL — ABNORMAL HIGH (ref 30.0–36.0)
MCHC: 36.6 g/dL — ABNORMAL HIGH (ref 30.0–36.0)
MCV: 85.3 fL (ref 80.0–100.0)
MCV: 85 fL (ref 80.0–100.0)
MCV: 86 fL (ref 80.0–100.0)
MCV: 86.6 fL (ref 80.0–100.0)
Platelets: 37 10*3/uL — ABNORMAL LOW (ref 150–400)
Platelets: 22 10*3/uL — CL (ref 150–400)
Platelets: 90 10*3/uL — ABNORMAL LOW (ref 150–400)
Platelets: 86 10*3/uL — ABNORMAL LOW (ref 150–400)
RBC: 2.92 MIL/uL — ABNORMAL LOW (ref 4.22–5.81)
RBC: 2.2 MIL/uL — ABNORMAL LOW (ref 4.22–5.81)
RBC: 2.07 MIL/uL — ABNORMAL LOW (ref 4.22–5.81)
RBC: 2.84 MIL/uL — ABNORMAL LOW (ref 4.22–5.81)
RDW: 16.3 % — ABNORMAL HIGH (ref 11.5–15.5)
RDW: 15.9 % — ABNORMAL HIGH (ref 11.5–15.5)
RDW: 16 % — ABNORMAL HIGH (ref 11.5–15.5)
RDW: 15.3 % (ref 11.5–15.5)
WBC: 7.9 10*3/uL (ref 4.0–10.5)
WBC: 5.3 10*3/uL (ref 4.0–10.5)
WBC: 9 10*3/uL (ref 4.0–10.5)
WBC: 13.6 10*3/uL — ABNORMAL HIGH (ref 4.0–10.5)
nRBC: 0 % (ref 0.0–0.2)
nRBC: 0 % (ref 0.0–0.2)
nRBC: 0 % (ref 0.0–0.2)
nRBC: 0 % (ref 0.0–0.2)

## 2022-10-18 LAB — BASIC METABOLIC PANEL
Anion gap: 18 — ABNORMAL HIGH (ref 5–15)
BUN: 8 mg/dL (ref 6–20)
CO2: 15 mmol/L — ABNORMAL LOW (ref 22–32)
Calcium: 7.6 mg/dL — ABNORMAL LOW (ref 8.9–10.3)
Chloride: 99 mmol/L (ref 98–111)
Creatinine, Ser: 1.8 mg/dL — ABNORMAL HIGH (ref 0.61–1.24)
GFR, Estimated: 52 mL/min — ABNORMAL LOW (ref >60)
Glucose, Bld: 53 mg/dL — ABNORMAL LOW (ref 70–99)
Potassium: 2.8 mmol/L — ABNORMAL LOW (ref 3.5–5.1)
Sodium: 132 mmol/L — ABNORMAL LOW (ref 135–145)

## 2022-10-18 LAB — URINALYSIS, ROUTINE W REFLEX MICROSCOPIC
Glucose, UA: NEGATIVE mg/dL
Ketones, ur: 5 mg/dL — AB
Leukocytes,Ua: NEGATIVE
Nitrite: NEGATIVE
Protein, ur: 30 mg/dL — AB
RBC / HPF: >50 RBC/hpf (ref 0–5)
Specific Gravity, Urine: 1.011 (ref 1.005–1.030)
pH: 6 (ref 5.0–8.0)

## 2022-10-18 LAB — RAPID URINE DRUG SCREEN, HOSP PERFORMED
Amphetamines: NOT DETECTED
Barbiturates: NOT DETECTED
Benzodiazepines: NOT DETECTED
Cocaine: NOT DETECTED
Opiates: NOT DETECTED
Tetrahydrocannabinol: NOT DETECTED

## 2022-10-18 LAB — I-STAT VENOUS BLOOD GAS, ED
Acid-Base Excess: 2 mmol/L (ref 0.0–2.0)
Bicarbonate: 24.2 mmol/L (ref 20.0–28.0)
Calcium, Ion: 0.97 mmol/L — ABNORMAL LOW (ref 1.15–1.40)
HCT: 32 % — ABNORMAL LOW (ref 39.0–52.0)
Hemoglobin: 10.9 g/dL — ABNORMAL LOW (ref 13.0–17.0)
O2 Saturation: 99 %
Potassium: 3.8 mmol/L (ref 3.5–5.1)
Sodium: 129 mmol/L — ABNORMAL LOW (ref 135–145)
TCO2: 25 mmol/L (ref 22–32)
pCO2, Ven: 29.4 mmHg — ABNORMAL LOW (ref 44–60)
pH, Ven: 7.523 — ABNORMAL HIGH (ref 7.25–7.43)
pO2, Ven: 135 mmHg — ABNORMAL HIGH (ref 32–45)

## 2022-10-18 LAB — DIC (DISSEMINATED INTRAVASCULAR COAGULATION)PANEL
D-Dimer, Quant: 7.27 ug/mL-FEU — ABNORMAL HIGH (ref 0.00–0.50)
Fibrinogen: 140 mg/dL — ABNORMAL LOW (ref 210–475)
INR: 4.1 (ref 0.8–1.2)
Platelets: 87 10*3/uL — ABNORMAL LOW (ref 150–400)
Prothrombin Time: 39.2 seconds — ABNORMAL HIGH (ref 11.4–15.2)
Smear Review: NONE SEEN
aPTT: 61 seconds — ABNORMAL HIGH (ref 24–36)

## 2022-10-18 LAB — BLOOD GAS, VENOUS
Acid-base deficit: 1.1 mmol/L (ref 0.0–2.0)
Bicarbonate: 22.4 mmol/L (ref 20.0–28.0)
O2 Saturation: 71.2 %
Patient temperature: 37.1
pCO2, Ven: 33 mmHg — ABNORMAL LOW (ref 44–60)
pH, Ven: 7.44 — ABNORMAL HIGH (ref 7.25–7.43)
pO2, Ven: 44 mmHg (ref 32–45)

## 2022-10-18 LAB — OSMOLALITY: Osmolality: 277 mOsm/kg (ref 275–295)

## 2022-10-18 LAB — GLUCOSE, CAPILLARY
Glucose-Capillary: 131 mg/dL — ABNORMAL HIGH (ref 70–99)
Glucose-Capillary: 59 mg/dL — ABNORMAL LOW (ref 70–99)
Glucose-Capillary: 77 mg/dL (ref 70–99)
Glucose-Capillary: 91 mg/dL (ref 70–99)
Glucose-Capillary: 92 mg/dL (ref 70–99)
Glucose-Capillary: 92 mg/dL (ref 70–99)
Glucose-Capillary: 95 mg/dL (ref 70–99)

## 2022-10-18 LAB — LACTIC ACID, PLASMA
Lactic Acid, Venous: 4.3 mmol/L (ref 0.5–1.9)
Lactic Acid, Venous: 4.8 mmol/L (ref 0.5–1.9)
Lactic Acid, Venous: 6.1 mmol/L (ref 0.5–1.9)
Lactic Acid, Venous: 7.4 mmol/L (ref 0.5–1.9)
Lactic Acid, Venous: 6.3 mmol/L (ref 0.5–1.9)

## 2022-10-18 LAB — PREPARE RBC (CROSSMATCH)

## 2022-10-18 LAB — FERRITIN: Ferritin: 3962 ng/mL — ABNORMAL HIGH (ref 24–336)

## 2022-10-18 LAB — AMYLASE: Amylase: 108 U/L — ABNORMAL HIGH (ref 28–100)

## 2022-10-18 LAB — SODIUM
Sodium: 127 mmol/L — ABNORMAL LOW (ref 135–145)
Sodium: 130 mmol/L — ABNORMAL LOW (ref 135–145)

## 2022-10-18 LAB — MONONUCLEOSIS SCREEN: Mono Screen: NEGATIVE

## 2022-10-18 LAB — PROTIME-INR
INR: 6.2 (ref 0.8–1.2)
Prothrombin Time: 54.2 seconds — ABNORMAL HIGH (ref 11.4–15.2)

## 2022-10-18 LAB — MAGNESIUM: Magnesium: 2.9 mg/dL — ABNORMAL HIGH (ref 1.7–2.4)

## 2022-10-18 LAB — HIV ANTIBODY (ROUTINE TESTING W REFLEX): HIV Screen 4th Generation wRfx: NONREACTIVE

## 2022-10-18 LAB — PROCALCITONIN: Procalcitonin: 0.99 ng/mL

## 2022-10-18 LAB — MRSA NEXT GEN BY PCR, NASAL: MRSA by PCR Next Gen: NOT DETECTED

## 2022-10-18 LAB — ABO/RH: ABO/RH(D): O POS

## 2022-10-18 LAB — OSMOLALITY, URINE: Osmolality, Ur: 269 mOsm/kg — ABNORMAL LOW (ref 300–900)

## 2022-10-18 LAB — SODIUM, URINE, RANDOM: Sodium, Ur: 36 mmol/L

## 2022-10-18 MED ORDER — MIDAZOLAM-SODIUM CHLORIDE 100-0.9 MG/100ML-% IV SOLN: 0.5000 mg/h | INTRAVENOUS | Status: DC

## 2022-10-18 MED ORDER — MAGNESIUM SULFATE 2 GM/50ML IV SOLN
2.0000 g | Freq: Once | INTRAVENOUS | Status: AC
  Administered 2022-10-18: 2 g via INTRAVENOUS
  Filled 2022-10-18: qty 50

## 2022-10-18 MED ORDER — POTASSIUM CHLORIDE 10 MEQ/50ML IV SOLN
INTRAVENOUS | Status: AC
  Administered 2022-10-18: 10 meq via INTRAVENOUS
  Filled 2022-10-18: qty 50

## 2022-10-18 MED ORDER — LACTATED RINGERS IV BOLUS
1000.0000 mL | Freq: Once | INTRAVENOUS | Status: AC
  Administered 2022-10-18: 1000 mL via INTRAVENOUS

## 2022-10-18 MED ORDER — POTASSIUM CHLORIDE 10 MEQ/100ML IV SOLN
10.0000 meq | INTRAVENOUS | Status: AC
  Administered 2022-10-18 – 2022-10-19 (×6): 10 meq via INTRAVENOUS
  Filled 2022-10-18 (×6): qty 100

## 2022-10-18 MED ORDER — POTASSIUM CHLORIDE 10 MEQ/100ML IV SOLN
10.0000 meq | INTRAVENOUS | Status: AC
  Administered 2022-10-18 (×3): 10 meq via INTRAVENOUS
  Filled 2022-10-18 (×3): qty 100

## 2022-10-18 MED ORDER — ROCURONIUM BROMIDE 10 MG/ML (PF) SYRINGE: 100.0000 mg | PREFILLED_SYRINGE | Freq: Once | INTRAVENOUS | Status: AC

## 2022-10-18 MED ORDER — LACTULOSE 10 GM/15ML PO SOLN: 30.0000 g | Freq: Three times a day (TID) | ORAL | Status: DC

## 2022-10-18 MED ORDER — IOHEXOL 350 MG/ML SOLN
75.0000 mL | Freq: Once | INTRAVENOUS | Status: AC | PRN
  Administered 2022-10-18: 75 mL via INTRAVENOUS

## 2022-10-18 MED ORDER — DEXMEDETOMIDINE HCL IN NACL 400 MCG/100ML IV SOLN
0.0000 ug/kg/h | INTRAVENOUS | Status: DC
  Filled 2022-10-18: qty 100

## 2022-10-18 MED ORDER — ROCURONIUM BROMIDE 10 MG/ML (PF) SYRINGE
PREFILLED_SYRINGE | INTRAVENOUS | Status: AC
  Administered 2022-10-18: 100 mg via INTRAVENOUS
  Filled 2022-10-18: qty 10

## 2022-10-18 MED ORDER — ETOMIDATE 2 MG/ML IV SOLN
INTRAVENOUS | Status: AC
  Administered 2022-10-18: 15 mg via INTRAVENOUS
  Filled 2022-10-18: qty 20

## 2022-10-18 MED ORDER — POTASSIUM CHLORIDE 20 MEQ PO PACK: 20.0000 meq | PACK | ORAL | Status: DC

## 2022-10-18 MED ORDER — NOREPINEPHRINE 4 MG/250ML-% IV SOLN
INTRAVENOUS | Status: AC
  Administered 2022-10-18: 10 ug/min via INTRAVENOUS
  Filled 2022-10-18: qty 250

## 2022-10-18 MED ORDER — FENTANYL CITRATE PF 50 MCG/ML IJ SOSY
50.0000 ug | PREFILLED_SYRINGE | Freq: Once | INTRAMUSCULAR | Status: AC
  Administered 2022-10-18: 50 ug via INTRAVENOUS

## 2022-10-18 MED ORDER — LEVETIRACETAM IN NACL 1000 MG/100ML IV SOLN
1000.0000 mg | Freq: Once | INTRAVENOUS | Status: AC
  Administered 2022-10-18: 1000 mg via INTRAVENOUS
  Filled 2022-10-18: qty 100

## 2022-10-18 MED ORDER — POTASSIUM CHLORIDE 10 MEQ/50ML IV SOLN
10.0000 meq | INTRAVENOUS | Status: AC
  Administered 2022-10-18 (×5): 10 meq via INTRAVENOUS
  Filled 2022-10-18 (×5): qty 50

## 2022-10-18 MED ORDER — FENTANYL CITRATE PF 50 MCG/ML IJ SOSY
50.0000 ug | PREFILLED_SYRINGE | INTRAMUSCULAR | Status: DC | PRN
  Administered 2022-10-18: 100 ug via INTRAVENOUS
  Administered 2022-10-18 – 2022-10-19 (×4): 50 ug via INTRAVENOUS
  Administered 2022-10-19: 100 ug via INTRAVENOUS
  Administered 2022-10-19: 50 ug via INTRAVENOUS
  Administered 2022-10-19 (×2): 100 ug via INTRAVENOUS
  Administered 2022-10-19: 50 ug via INTRAVENOUS
  Administered 2022-10-19: 200 ug via INTRAVENOUS
  Administered 2022-10-21: 50 ug via INTRAVENOUS
  Administered 2022-10-22 – 2022-10-23 (×4): 100 ug via INTRAVENOUS
  Filled 2022-10-18: qty 1
  Filled 2022-10-18: qty 2
  Filled 2022-10-18: qty 4
  Filled 2022-10-18: qty 2
  Filled 2022-10-18: qty 4
  Filled 2022-10-18: qty 1
  Filled 2022-10-18: qty 2
  Filled 2022-10-18 (×2): qty 1
  Filled 2022-10-18 (×2): qty 2
  Filled 2022-10-18 (×3): qty 1
  Filled 2022-10-18: qty 2
  Filled 2022-10-18 (×4): qty 1
  Filled 2022-10-18: qty 4

## 2022-10-18 MED ORDER — NOREPINEPHRINE 4 MG/250ML-% IV SOLN
2.0000 ug/min | INTRAVENOUS | Status: DC
  Filled 2022-10-18: qty 250

## 2022-10-18 MED ORDER — ORAL CARE MOUTH RINSE
15.0000 mL | OROMUCOSAL | Status: DC
  Administered 2022-10-18 – 2022-10-23 (×63): 15 mL via OROMUCOSAL

## 2022-10-18 MED ORDER — PHENYLEPHRINE 80 MCG/ML (10ML) SYRINGE FOR IV PUSH (FOR BLOOD PRESSURE SUPPORT)
PREFILLED_SYRINGE | INTRAVENOUS | Status: AC
  Administered 2022-10-18: 800 ug via INTRAVENOUS
  Filled 2022-10-18: qty 10

## 2022-10-18 MED ORDER — MIDAZOLAM HCL 2 MG/2ML IJ SOLN
2.0000 mg | Freq: Once | INTRAMUSCULAR | Status: AC
  Administered 2022-10-18: 2 mg via INTRAVENOUS

## 2022-10-18 MED ORDER — SODIUM CHLORIDE 0.9% IV SOLUTION: Freq: Once | INTRAVENOUS | Status: AC

## 2022-10-18 MED ORDER — POTASSIUM CHLORIDE 20 MEQ PO PACK
40.0000 meq | PACK | ORAL | Status: DC
  Administered 2022-10-18: 40 meq
  Filled 2022-10-18: qty 2

## 2022-10-18 MED ORDER — PANTOPRAZOLE SODIUM 40 MG IV SOLR
40.0000 mg | Freq: Two times a day (BID) | INTRAVENOUS | Status: DC
  Administered 2022-10-18 – 2022-11-01 (×28): 40 mg via INTRAVENOUS
  Filled 2022-10-18 (×28): qty 10

## 2022-10-18 MED ORDER — FENTANYL 2500MCG IN NS 250ML (10MCG/ML) PREMIX INFUSION
50.0000 ug/h | INTRAVENOUS | Status: DC
  Filled 2022-10-18: qty 250

## 2022-10-18 MED ORDER — MIDAZOLAM HCL 2 MG/2ML IJ SOLN
INTRAMUSCULAR | Status: AC
  Filled 2022-10-18: qty 2

## 2022-10-18 MED ORDER — VITAMIN K1 10 MG/ML IJ SOLN
10.0000 mg | Freq: Every day | INTRAVENOUS | Status: AC
  Administered 2022-10-19 – 2022-10-20 (×2): 10 mg via INTRAVENOUS
  Filled 2022-10-18 (×2): qty 1

## 2022-10-18 MED ORDER — SODIUM CHLORIDE 0.9 % IV SOLN
250.0000 mL | INTRAVENOUS | Status: DC
  Administered 2022-10-18: 250 mL via INTRAVENOUS

## 2022-10-18 MED ORDER — SODIUM CHLORIDE 0.9 % IV SOLN
2.0000 g | Freq: Every day | INTRAVENOUS | Status: DC
  Administered 2022-10-18 (×2): 2 g via INTRAVENOUS
  Filled 2022-10-18 (×2): qty 20

## 2022-10-18 MED ORDER — PANTOPRAZOLE 80MG IVPB - SIMPLE MED
80.0000 mg | Freq: Once | INTRAVENOUS | Status: AC
  Administered 2022-10-18: 80 mg via INTRAVENOUS
  Filled 2022-10-18: qty 100

## 2022-10-18 MED ORDER — POTASSIUM CHLORIDE 10 MEQ/50ML IV SOLN: 10.0000 meq | INTRAVENOUS | Status: DC

## 2022-10-18 MED ORDER — MIDAZOLAM HCL 2 MG/2ML IJ SOLN: 2.0000 mg | Freq: Once | INTRAMUSCULAR | Status: AC

## 2022-10-18 MED ORDER — DEXTROSE 50 % IV SOLN
INTRAVENOUS | Status: AC
  Filled 2022-10-18: qty 50

## 2022-10-18 MED ORDER — LACTATED RINGERS IV BOLUS
500.0000 mL | Freq: Once | INTRAVENOUS | Status: AC
  Administered 2022-10-18: 500 mL via INTRAVENOUS

## 2022-10-18 MED ORDER — ACETYLCYSTEINE LOAD VIA INFUSION
150.0000 mg/kg | Freq: Once | INTRAVENOUS | Status: AC
  Administered 2022-10-18: 10890 mg via INTRAVENOUS
  Filled 2022-10-18: qty 357

## 2022-10-18 MED ORDER — LACTULOSE 10 GM/15ML PO SOLN
30.0000 g | Freq: Three times a day (TID) | ORAL | Status: DC
  Administered 2022-10-18: 30 g
  Filled 2022-10-18 (×2): qty 45

## 2022-10-18 MED ORDER — SODIUM CHLORIDE 0.9 % IV SOLN
50.0000 ug/h | INTRAVENOUS | Status: DC
  Administered 2022-10-18 – 2022-10-22 (×8): 50 ug/h via INTRAVENOUS
  Filled 2022-10-18 (×10): qty 1

## 2022-10-18 MED ORDER — DEXTROSE 50 % IV SOLN
12.5000 g | INTRAVENOUS | Status: AC
  Administered 2022-10-18: 12.5 g via INTRAVENOUS

## 2022-10-18 MED ORDER — RIFAXIMIN 550 MG PO TABS
550.0000 mg | ORAL_TABLET | Freq: Two times a day (BID) | ORAL | Status: DC
  Administered 2022-10-18 – 2022-11-01 (×28): 550 mg
  Filled 2022-10-18 (×28): qty 1

## 2022-10-18 MED ORDER — LACTULOSE 10 GM/15ML PO SOLN
30.0000 g | Freq: Four times a day (QID) | ORAL | Status: DC
  Administered 2022-10-18 – 2022-10-28 (×41): 30 g
  Filled 2022-10-18 (×41): qty 45

## 2022-10-18 MED ORDER — GADOBUTROL 1 MMOL/ML IV SOLN
7.5000 mL | Freq: Once | INTRAVENOUS | Status: AC | PRN
  Administered 2022-10-18: 7.5 mL via INTRAVENOUS

## 2022-10-18 MED ORDER — FENTANYL BOLUS VIA INFUSION: 50.0000 ug | INTRAVENOUS | Status: DC | PRN

## 2022-10-18 MED ORDER — ETOMIDATE 2 MG/ML IV SOLN: 15.0000 mg | Freq: Once | INTRAVENOUS | Status: AC

## 2022-10-18 MED ORDER — ORAL CARE MOUTH RINSE: 15.0000 mL | OROMUCOSAL | Status: DC | PRN

## 2022-10-18 MED ORDER — MIDAZOLAM HCL 2 MG/2ML IJ SOLN
INTRAMUSCULAR | Status: AC
  Administered 2022-10-18: 2 mg via INTRAVENOUS
  Filled 2022-10-18: qty 4

## 2022-10-18 MED ORDER — CHLORHEXIDINE GLUCONATE CLOTH 2 % EX PADS
6.0000 | MEDICATED_PAD | Freq: Every day | CUTANEOUS | Status: DC
  Administered 2022-10-18 – 2022-10-31 (×16): 6 via TOPICAL

## 2022-10-18 MED ORDER — SODIUM CHLORIDE 0.9 % IV SOLN: INTRAVENOUS | Status: AC | PRN

## 2022-10-18 MED ORDER — SODIUM CHLORIDE 0.9% IV SOLUTION: Freq: Once | INTRAVENOUS | Status: DC

## 2022-10-18 MED ORDER — NOREPINEPHRINE 4 MG/250ML-% IV SOLN
0.0000 ug/min | INTRAVENOUS | Status: DC
  Administered 2022-10-18 (×2): 4 ug/min via INTRAVENOUS
  Administered 2022-10-19: 10 ug/min via INTRAVENOUS
  Administered 2022-10-19: 8 ug/min via INTRAVENOUS
  Administered 2022-10-19: 4 ug/min via INTRAVENOUS
  Filled 2022-10-18 (×3): qty 250

## 2022-10-18 MED ORDER — DEXTROSE 5 % IV SOLN
12.5000 mg/kg/h | INTRAVENOUS | Status: AC
  Administered 2022-10-18: 12.5 mg/kg/h via INTRAVENOUS
  Filled 2022-10-18 (×2): qty 90

## 2022-10-18 MED ORDER — PHENYLEPHRINE 80 MCG/ML (10ML) SYRINGE FOR IV PUSH (FOR BLOOD PRESSURE SUPPORT): 800.0000 ug | PREFILLED_SYRINGE | Freq: Once | INTRAVENOUS | Status: AC

## 2022-10-18 MED ORDER — METRONIDAZOLE 500 MG/100ML IV SOLN
500.0000 mg | Freq: Two times a day (BID) | INTRAVENOUS | Status: DC
  Administered 2022-10-18 (×2): 500 mg via INTRAVENOUS
  Filled 2022-10-18 (×2): qty 100

## 2022-10-18 MED ORDER — VITAMIN K1 10 MG/ML IJ SOLN
10.0000 mg | Freq: Once | INTRAVENOUS | Status: AC
  Administered 2022-10-18: 10 mg via INTRAVENOUS
  Filled 2022-10-18: qty 1

## 2022-10-18 MED ORDER — PHENYLEPHRINE 80 MCG/ML (10ML) SYRINGE FOR IV PUSH (FOR BLOOD PRESSURE SUPPORT): 80.0000 ug | PREFILLED_SYRINGE | Freq: Once | INTRAVENOUS | Status: DC

## 2022-10-18 MED ORDER — DEXTROSE 5 % IV SOLN
6.2500 mg/kg/h | INTRAVENOUS | Status: AC
  Administered 2022-10-18 – 2022-10-20 (×2): 6.25 mg/kg/h via INTRAVENOUS
  Filled 2022-10-18 (×2): qty 90

## 2022-10-18 MED ORDER — PHENYLEPHRINE 80 MCG/ML (10ML) SYRINGE FOR IV PUSH (FOR BLOOD PRESSURE SUPPORT)
PREFILLED_SYRINGE | INTRAVENOUS | Status: AC
  Filled 2022-10-18: qty 10

## 2022-10-18 MED ORDER — SODIUM CHLORIDE 3 % IV SOLN
INTRAVENOUS | Status: DC
  Filled 2022-10-18 (×2): qty 500

## 2022-10-18 MED ORDER — METOCLOPRAMIDE HCL 5 MG/ML IJ SOLN
5.0000 mg | Freq: Once | INTRAMUSCULAR | Status: AC
  Administered 2022-10-18: 5 mg via INTRAVENOUS
  Filled 2022-10-18: qty 2

## 2022-10-18 MED ORDER — PHENYLEPHRINE 80 MCG/ML (10ML) SYRINGE FOR IV PUSH (FOR BLOOD PRESSURE SUPPORT): 160.0000 ug | PREFILLED_SYRINGE | Freq: Once | INTRAVENOUS | Status: DC

## 2022-10-18 MED ORDER — MAGNESIUM SULFATE 4 GM/100ML IV SOLN
4.0000 g | Freq: Once | INTRAVENOUS | Status: AC
  Administered 2022-10-18: 4 g via INTRAVENOUS
  Filled 2022-10-18: qty 100

## 2022-10-18 MED ORDER — IOHEXOL 350 MG/ML SOLN
60.0000 mL | Freq: Once | INTRAVENOUS | Status: AC | PRN
  Administered 2022-10-18: 60 mL via INTRAVENOUS

## 2022-10-19 ENCOUNTER — Inpatient Hospital Stay (HOSPITAL_COMMUNITY): Payer: Self-pay

## 2022-10-19 LAB — PREPARE PLATELET PHERESIS
Unit division: 0
Unit division: 0

## 2022-10-19 LAB — CBC
HCT: 24.8 % — ABNORMAL LOW (ref 39.0–52.0)
HCT: 19.5 % — ABNORMAL LOW (ref 39.0–52.0)
Hemoglobin: 9.2 g/dL — ABNORMAL LOW (ref 13.0–17.0)
Hemoglobin: 7.2 g/dL — ABNORMAL LOW (ref 13.0–17.0)
MCH: 32.4 pg (ref 26.0–34.0)
MCH: 31.9 pg (ref 26.0–34.0)
MCHC: 37.1 g/dL — ABNORMAL HIGH (ref 30.0–36.0)
MCHC: 36.9 g/dL — ABNORMAL HIGH (ref 30.0–36.0)
MCV: 87.3 fL (ref 80.0–100.0)
MCV: 86.3 fL (ref 80.0–100.0)
Platelets: 83 10*3/uL — ABNORMAL LOW (ref 150–400)
Platelets: 41 10*3/uL — ABNORMAL LOW (ref 150–400)
RBC: 2.84 MIL/uL — ABNORMAL LOW (ref 4.22–5.81)
RBC: 2.26 MIL/uL — ABNORMAL LOW (ref 4.22–5.81)
RDW: 15.7 % — ABNORMAL HIGH (ref 11.5–15.5)
RDW: 15.8 % — ABNORMAL HIGH (ref 11.5–15.5)
WBC: 16.5 10*3/uL — ABNORMAL HIGH (ref 4.0–10.5)
WBC: 11.9 10*3/uL — ABNORMAL HIGH (ref 4.0–10.5)
nRBC: 0 % (ref 0.0–0.2)
nRBC: 0.3 % — ABNORMAL HIGH (ref 0.0–0.2)

## 2022-10-19 LAB — EPSTEIN-BARR VIRUS VCA, IGM: EBV VCA IgM: <36 U/mL (ref 0.0–35.9)

## 2022-10-19 LAB — COMPREHENSIVE METABOLIC PANEL
ALT: 79 U/L — ABNORMAL HIGH (ref 0–44)
AST: 290 U/L — ABNORMAL HIGH (ref 15–41)
Albumin: <1.5 g/dL — ABNORMAL LOW (ref 3.5–5.0)
Alkaline Phosphatase: 111 U/L (ref 38–126)
Anion gap: 15 (ref 5–15)
BUN: 12 mg/dL (ref 6–20)
CO2: 15 mmol/L — ABNORMAL LOW (ref 22–32)
Calcium: 7.9 mg/dL — ABNORMAL LOW (ref 8.9–10.3)
Chloride: 101 mmol/L (ref 98–111)
Creatinine, Ser: 2.6 mg/dL — ABNORMAL HIGH (ref 0.61–1.24)
GFR, Estimated: 33 mL/min — ABNORMAL LOW (ref >60)
Glucose, Bld: 84 mg/dL (ref 70–99)
Potassium: 3.8 mmol/L (ref 3.5–5.1)
Sodium: 131 mmol/L — ABNORMAL LOW (ref 135–145)
Total Bilirubin: 17.9 mg/dL — ABNORMAL HIGH (ref 0.3–1.2)
Total Protein: 5.8 g/dL — ABNORMAL LOW (ref 6.5–8.1)

## 2022-10-19 LAB — PREPARE FRESH FROZEN PLASMA: Unit division: 0

## 2022-10-19 LAB — CMV IGM
CMV IgM: 64.1 AU/mL — ABNORMAL HIGH (ref 0.0–29.9)
CMV IgM: 82.5 AU/mL — ABNORMAL HIGH (ref 0.0–29.9)

## 2022-10-19 LAB — POCT I-STAT 7, (LYTES, BLD GAS, ICA,H+H)
Acid-base deficit: 11 mmol/L — ABNORMAL HIGH (ref 0.0–2.0)
Bicarbonate: 13.2 mmol/L — ABNORMAL LOW (ref 20.0–28.0)
Calcium, Ion: 1.09 mmol/L — ABNORMAL LOW (ref 1.15–1.40)
HCT: 27 % — ABNORMAL LOW (ref 39.0–52.0)
Hemoglobin: 9.2 g/dL — ABNORMAL LOW (ref 13.0–17.0)
O2 Saturation: 100 %
Patient temperature: 96.7
Potassium: 3.3 mmol/L — ABNORMAL LOW (ref 3.5–5.1)
Sodium: 135 mmol/L (ref 135–145)
TCO2: 14 mmol/L — ABNORMAL LOW (ref 22–32)
pCO2 arterial: 21.7 mmHg — ABNORMAL LOW (ref 32–48)
pH, Arterial: 7.386 (ref 7.35–7.45)
pO2, Arterial: 207 mmHg — ABNORMAL HIGH (ref 83–108)

## 2022-10-19 LAB — LACTIC ACID, PLASMA
Lactic Acid, Venous: 6.7 mmol/L (ref 0.5–1.9)
Lactic Acid, Venous: 4.6 mmol/L (ref 0.5–1.9)

## 2022-10-19 LAB — BASIC METABOLIC PANEL
Anion gap: 17 — ABNORMAL HIGH (ref 5–15)
Anion gap: 17 — ABNORMAL HIGH (ref 5–15)
BUN: 13 mg/dL (ref 6–20)
BUN: 15 mg/dL (ref 6–20)
CO2: 13 mmol/L — ABNORMAL LOW (ref 22–32)
CO2: 16 mmol/L — ABNORMAL LOW (ref 22–32)
Calcium: 7.7 mg/dL — ABNORMAL LOW (ref 8.9–10.3)
Calcium: 8.4 mg/dL — ABNORMAL LOW (ref 8.9–10.3)
Chloride: 103 mmol/L (ref 98–111)
Chloride: 100 mmol/L (ref 98–111)
Creatinine, Ser: 3.1 mg/dL — ABNORMAL HIGH (ref 0.61–1.24)
Creatinine, Ser: 3.41 mg/dL — ABNORMAL HIGH (ref 0.61–1.24)
GFR, Estimated: 27 mL/min — ABNORMAL LOW (ref >60)
GFR, Estimated: 24 mL/min — ABNORMAL LOW (ref >60)
Glucose, Bld: 132 mg/dL — ABNORMAL HIGH (ref 70–99)
Glucose, Bld: 102 mg/dL — ABNORMAL HIGH (ref 70–99)
Potassium: 2.7 mmol/L — CL (ref 3.5–5.1)
Potassium: 3.2 mmol/L — ABNORMAL LOW (ref 3.5–5.1)
Sodium: 133 mmol/L — ABNORMAL LOW (ref 135–145)
Sodium: 133 mmol/L — ABNORMAL LOW (ref 135–145)

## 2022-10-19 LAB — BPAM PLATELET PHERESIS
Blood Product Expiration Date: 202402252359
Blood Product Expiration Date: 202402262359
ISSUE DATE / TIME: 202402241158
ISSUE DATE / TIME: 202402241158
Unit Type and Rh: 5100
Unit Type and Rh: 6200

## 2022-10-19 LAB — DIC (DISSEMINATED INTRAVASCULAR COAGULATION)PANEL
D-Dimer, Quant: 8.35 ug/mL-FEU — ABNORMAL HIGH (ref 0.00–0.50)
D-Dimer, Quant: 6.82 ug/mL-FEU — ABNORMAL HIGH (ref 0.00–0.50)
Fibrinogen: 148 mg/dL — ABNORMAL LOW (ref 210–475)
Fibrinogen: 132 mg/dL — ABNORMAL LOW (ref 210–475)
INR: 3.3 — ABNORMAL HIGH (ref 0.8–1.2)
INR: 3.8 — ABNORMAL HIGH (ref 0.8–1.2)
Platelets: 86 10*3/uL — ABNORMAL LOW (ref 150–400)
Platelets: 43 10*3/uL — ABNORMAL LOW (ref 150–400)
Prothrombin Time: 33.1 seconds — ABNORMAL HIGH (ref 11.4–15.2)
Prothrombin Time: 37.4 seconds — ABNORMAL HIGH (ref 11.4–15.2)
Smear Review: NONE SEEN
Smear Review: NONE SEEN
aPTT: 59 seconds — ABNORMAL HIGH (ref 24–36)
aPTT: 57 seconds — ABNORMAL HIGH (ref 24–36)

## 2022-10-19 LAB — BPAM FFP
Blood Product Expiration Date: 202402292359
Blood Product Expiration Date: 202402292359
ISSUE DATE / TIME: 202402240812
ISSUE DATE / TIME: 202402240812
Unit Type and Rh: 5100
Unit Type and Rh: 5100

## 2022-10-19 LAB — HEPATITIS B E ANTIGEN: Hep B E Ag: NEGATIVE

## 2022-10-19 LAB — CMV ANTIBODY, IGG (EIA): CMV Ab - IgG: 1.1 U/mL — ABNORMAL HIGH (ref 0.00–0.59)

## 2022-10-19 LAB — GLUCOSE, CAPILLARY
Glucose-Capillary: 94 mg/dL (ref 70–99)
Glucose-Capillary: 95 mg/dL (ref 70–99)
Glucose-Capillary: 139 mg/dL — ABNORMAL HIGH (ref 70–99)
Glucose-Capillary: 120 mg/dL — ABNORMAL HIGH (ref 70–99)
Glucose-Capillary: 109 mg/dL — ABNORMAL HIGH (ref 70–99)
Glucose-Capillary: 93 mg/dL (ref 70–99)

## 2022-10-19 LAB — EPSTEIN-BARR VIRUS (EBV) ANTIBODY PROFILE
EBV NA IgG: >600 U/mL — ABNORMAL HIGH (ref 0.0–17.9)
EBV VCA IgG: >600 U/mL — ABNORMAL HIGH (ref 0.0–17.9)
EBV VCA IgM: <36 U/mL (ref 0.0–35.9)

## 2022-10-19 LAB — MAGNESIUM: Magnesium: 2.6 mg/dL — ABNORMAL HIGH (ref 1.7–2.4)

## 2022-10-19 LAB — SODIUM
Sodium: 133 mmol/L — ABNORMAL LOW (ref 135–145)
Sodium: 134 mmol/L — ABNORMAL LOW (ref 135–145)
Sodium: 133 mmol/L — ABNORMAL LOW (ref 135–145)

## 2022-10-19 LAB — EPSTEIN-BARR VIRUS EARLY D ANTIGEN ANTIBODY, IGG: EBV Early Antigen Ab, IgG: <9 U/mL (ref 0.0–8.9)

## 2022-10-19 LAB — AMMONIA: Ammonia: 116 umol/L — ABNORMAL HIGH (ref 9–35)

## 2022-10-19 MED ORDER — ALBUMIN HUMAN 25 % IV SOLN
25.0000 g | Freq: Four times a day (QID) | INTRAVENOUS | Status: AC
  Administered 2022-10-19 – 2022-10-21 (×8): 25 g via INTRAVENOUS
  Filled 2022-10-19 (×8): qty 100

## 2022-10-19 MED ORDER — MIDODRINE HCL 5 MG PO TABS
5.0000 mg | ORAL_TABLET | Freq: Three times a day (TID) | ORAL | Status: DC
  Administered 2022-10-19 – 2022-10-22 (×5): 5 mg
  Filled 2022-10-19 (×6): qty 1

## 2022-10-19 MED ORDER — PIPERACILLIN-TAZOBACTAM IN DEX 2-0.25 GM/50ML IV SOLN
2.2500 g | Freq: Three times a day (TID) | INTRAVENOUS | Status: DC
  Administered 2022-10-19 – 2022-10-20 (×2): 2.25 g via INTRAVENOUS
  Filled 2022-10-19 (×3): qty 50

## 2022-10-19 MED ORDER — DEXMEDETOMIDINE HCL IN NACL 400 MCG/100ML IV SOLN
0.0000 ug/kg/h | INTRAVENOUS | Status: DC
  Administered 2022-10-19: 0.4 ug/kg/h via INTRAVENOUS
  Administered 2022-10-20: 0.6 ug/kg/h via INTRAVENOUS
  Administered 2022-10-20: 0.7 ug/kg/h via INTRAVENOUS
  Administered 2022-10-21: 0.9 ug/kg/h via INTRAVENOUS
  Administered 2022-10-21: 0.4 ug/kg/h via INTRAVENOUS
  Administered 2022-10-21 – 2022-10-22 (×3): 0.8 ug/kg/h via INTRAVENOUS
  Administered 2022-10-23: 0.4 ug/kg/h via INTRAVENOUS
  Administered 2022-10-23: 0.9 ug/kg/h via INTRAVENOUS
  Administered 2022-10-23 – 2022-10-24 (×2): 0.5 ug/kg/h via INTRAVENOUS
  Filled 2022-10-19 (×11): qty 100

## 2022-10-19 MED ORDER — DEXMEDETOMIDINE HCL IN NACL 400 MCG/100ML IV SOLN
INTRAVENOUS | Status: AC
  Filled 2022-10-19: qty 100

## 2022-10-19 MED ORDER — PIPERACILLIN-TAZOBACTAM 3.375 G IVPB
3.3750 g | Freq: Three times a day (TID) | INTRAVENOUS | Status: DC
  Administered 2022-10-19: 3.375 g via INTRAVENOUS
  Filled 2022-10-19: qty 50

## 2022-10-19 MED ORDER — SODIUM BICARBONATE 8.4 % IV SOLN
INTRAVENOUS | Status: DC
  Filled 2022-10-19: qty 150

## 2022-10-19 MED ORDER — POTASSIUM CHLORIDE 10 MEQ/50ML IV SOLN
10.0000 meq | INTRAVENOUS | Status: AC
  Administered 2022-10-19 – 2022-10-20 (×4): 10 meq via INTRAVENOUS
  Filled 2022-10-19 (×4): qty 50

## 2022-10-19 MED ORDER — POTASSIUM CHLORIDE 10 MEQ/50ML IV SOLN
10.0000 meq | INTRAVENOUS | Status: AC
  Administered 2022-10-19 (×4): 10 meq via INTRAVENOUS
  Filled 2022-10-19 (×4): qty 50

## 2022-10-20 DIAGNOSIS — K769 Liver disease, unspecified: Secondary | ICD-10-CM

## 2022-10-20 DIAGNOSIS — K7011 Alcoholic hepatitis with ascites: Secondary | ICD-10-CM

## 2022-10-20 DIAGNOSIS — K7682 Hepatic encephalopathy: Secondary | ICD-10-CM

## 2022-10-20 DIAGNOSIS — D696 Thrombocytopenia, unspecified: Secondary | ICD-10-CM

## 2022-10-20 LAB — MISC LABCORP TEST (SEND OUT): Labcorp test code: 144012

## 2022-10-20 LAB — DIC (DISSEMINATED INTRAVASCULAR COAGULATION)PANEL
D-Dimer, Quant: 5.93 ug/mL-FEU — ABNORMAL HIGH (ref 0.00–0.50)
D-Dimer, Quant: 3.91 ug/mL-FEU — ABNORMAL HIGH (ref 0.00–0.50)
Fibrinogen: 145 mg/dL — ABNORMAL LOW (ref 210–475)
Fibrinogen: 144 mg/dL — ABNORMAL LOW (ref 210–475)
INR: 3.9 — ABNORMAL HIGH (ref 0.8–1.2)
INR: 3.9 — ABNORMAL HIGH (ref 0.8–1.2)
Platelets: 34 10*3/uL — ABNORMAL LOW (ref 150–400)
Platelets: 34 10*3/uL — ABNORMAL LOW (ref 150–400)
Prothrombin Time: 38.2 seconds — ABNORMAL HIGH (ref 11.4–15.2)
Prothrombin Time: 38.1 seconds — ABNORMAL HIGH (ref 11.4–15.2)
Smear Review: NONE SEEN
Smear Review: NONE SEEN
aPTT: 64 seconds — ABNORMAL HIGH (ref 24–36)
aPTT: 61 seconds — ABNORMAL HIGH (ref 24–36)

## 2022-10-20 LAB — HEPATITIS B E ANTIBODY: Hep B E Ab: NEGATIVE

## 2022-10-20 LAB — POCT I-STAT 7, (LYTES, BLD GAS, ICA,H+H)
Acid-base deficit: 7 mmol/L — ABNORMAL HIGH (ref 0.0–2.0)
Bicarbonate: 15.4 mmol/L — ABNORMAL LOW (ref 20.0–28.0)
Calcium, Ion: 1.19 mmol/L (ref 1.15–1.40)
HCT: 20 % — ABNORMAL LOW (ref 39.0–52.0)
Hemoglobin: 6.8 g/dL — CL (ref 13.0–17.0)
O2 Saturation: 100 %
Patient temperature: 97.6
Potassium: 3.2 mmol/L — ABNORMAL LOW (ref 3.5–5.1)
Sodium: 137 mmol/L (ref 135–145)
TCO2: 16 mmol/L — ABNORMAL LOW (ref 22–32)
pCO2 arterial: 19.6 mmHg — CL (ref 32–48)
pH, Arterial: 7.503 — ABNORMAL HIGH (ref 7.35–7.45)
pO2, Arterial: 199 mmHg — ABNORMAL HIGH (ref 83–108)

## 2022-10-20 LAB — MAGNESIUM
Magnesium: 2.7 mg/dL — ABNORMAL HIGH (ref 1.7–2.4)
Magnesium: 2.9 mg/dL — ABNORMAL HIGH (ref 1.7–2.4)

## 2022-10-20 LAB — C DIFFICILE (CDIFF) QUICK SCRN (NO PCR REFLEX)
C Diff antigen: NEGATIVE
C Diff interpretation: NOT DETECTED
C Diff toxin: NEGATIVE

## 2022-10-20 LAB — GLUCOSE, CAPILLARY
Glucose-Capillary: 86 mg/dL (ref 70–99)
Glucose-Capillary: 88 mg/dL (ref 70–99)
Glucose-Capillary: 77 mg/dL (ref 70–99)
Glucose-Capillary: 81 mg/dL (ref 70–99)
Glucose-Capillary: 87 mg/dL (ref 70–99)
Glucose-Capillary: 111 mg/dL — ABNORMAL HIGH (ref 70–99)
Glucose-Capillary: 119 mg/dL — ABNORMAL HIGH (ref 70–99)

## 2022-10-20 LAB — CBC
HCT: 18.2 % — ABNORMAL LOW (ref 39.0–52.0)
HCT: 20.8 % — ABNORMAL LOW (ref 39.0–52.0)
Hemoglobin: 6.8 g/dL — CL (ref 13.0–17.0)
Hemoglobin: 7.5 g/dL — ABNORMAL LOW (ref 13.0–17.0)
MCH: 32.1 pg (ref 26.0–34.0)
MCH: 31.1 pg (ref 26.0–34.0)
MCHC: 37.4 g/dL — ABNORMAL HIGH (ref 30.0–36.0)
MCHC: 36.1 g/dL — ABNORMAL HIGH (ref 30.0–36.0)
MCV: 85.8 fL (ref 80.0–100.0)
MCV: 86.3 fL (ref 80.0–100.0)
Platelets: 38 10*3/uL — ABNORMAL LOW (ref 150–400)
Platelets: 34 10*3/uL — ABNORMAL LOW (ref 150–400)
RBC: 2.12 MIL/uL — ABNORMAL LOW (ref 4.22–5.81)
RBC: 2.41 MIL/uL — ABNORMAL LOW (ref 4.22–5.81)
RDW: 15.5 % (ref 11.5–15.5)
RDW: 15.7 % — ABNORMAL HIGH (ref 11.5–15.5)
WBC: 8.6 10*3/uL (ref 4.0–10.5)
WBC: 5.9 10*3/uL (ref 4.0–10.5)
nRBC: 0.2 % (ref 0.0–0.2)
nRBC: 0 % (ref 0.0–0.2)

## 2022-10-20 LAB — COMPREHENSIVE METABOLIC PANEL
ALT: 58 U/L — ABNORMAL HIGH (ref 0–44)
AST: 167 U/L — ABNORMAL HIGH (ref 15–41)
Albumin: 2.6 g/dL — ABNORMAL LOW (ref 3.5–5.0)
Alkaline Phosphatase: 41 U/L (ref 38–126)
Anion gap: 16 — ABNORMAL HIGH (ref 5–15)
BUN: 14 mg/dL (ref 6–20)
CO2: 16 mmol/L — ABNORMAL LOW (ref 22–32)
Calcium: 9 mg/dL (ref 8.9–10.3)
Chloride: 101 mmol/L (ref 98–111)
Creatinine, Ser: 3.04 mg/dL — ABNORMAL HIGH (ref 0.61–1.24)
GFR, Estimated: 28 mL/min — ABNORMAL LOW (ref >60)
Glucose, Bld: 85 mg/dL (ref 70–99)
Potassium: 3 mmol/L — ABNORMAL LOW (ref 3.5–5.1)
Sodium: 133 mmol/L — ABNORMAL LOW (ref 135–145)
Total Bilirubin: 20 mg/dL (ref 0.3–1.2)
Total Protein: 5.9 g/dL — ABNORMAL LOW (ref 6.5–8.1)

## 2022-10-20 LAB — LACTIC ACID, PLASMA
Lactic Acid, Venous: 4.2 mmol/L (ref 0.5–1.9)
Lactic Acid, Venous: 3.4 mmol/L (ref 0.5–1.9)

## 2022-10-20 LAB — ANTI-SMOOTH MUSCLE ANTIBODY, IGG: F-Actin IgG: 29 Units — ABNORMAL HIGH (ref 0–19)

## 2022-10-20 LAB — POTASSIUM: Potassium: 3.1 mmol/L — ABNORMAL LOW (ref 3.5–5.1)

## 2022-10-20 LAB — FOLATE: Folate: 9.4 ng/mL (ref >5.9)

## 2022-10-20 LAB — HEMOGLOBIN AND HEMATOCRIT, BLOOD
HCT: 21.6 % — ABNORMAL LOW (ref 39.0–52.0)
Hemoglobin: 7.9 g/dL — ABNORMAL LOW (ref 13.0–17.0)

## 2022-10-20 LAB — VARICELLA-ZOSTER BY PCR: Varicella-Zoster, PCR: NEGATIVE

## 2022-10-20 LAB — VITAMIN D 25 HYDROXY (VIT D DEFICIENCY, FRACTURES): Vit D, 25-Hydroxy: 13.21 ng/mL — ABNORMAL LOW (ref 30–100)

## 2022-10-20 LAB — PREPARE RBC (CROSSMATCH)

## 2022-10-20 LAB — PROTIME-INR
INR: 4 — ABNORMAL HIGH (ref 0.8–1.2)
Prothrombin Time: 38.4 seconds — ABNORMAL HIGH (ref 11.4–15.2)

## 2022-10-20 LAB — PHOSPHORUS: Phosphorus: <1 mg/dL — CL (ref 2.5–4.6)

## 2022-10-20 LAB — VITAMIN B12: Vitamin B-12: 3745 pg/mL — ABNORMAL HIGH (ref 180–914)

## 2022-10-20 LAB — ANA W/REFLEX IF POSITIVE: Anti Nuclear Antibody (ANA): NEGATIVE

## 2022-10-20 LAB — FACTOR 5 ASSAY: Factor V Activity: 23 % — ABNORMAL LOW (ref 70–150)

## 2022-10-20 LAB — ANTI-MICROSOMAL ANTIBODY LIVER / KIDNEY: LKM1 Ab: 1 Units (ref 0.0–20.0)

## 2022-10-20 MED ORDER — SODIUM CHLORIDE 0.9% IV SOLUTION: Freq: Once | INTRAVENOUS | Status: DC

## 2022-10-20 MED ORDER — PIPERACILLIN-TAZOBACTAM 3.375 G IVPB
3.3750 g | Freq: Three times a day (TID) | INTRAVENOUS | Status: DC
  Administered 2022-10-20 – 2022-10-21 (×3): 3.375 g via INTRAVENOUS
  Filled 2022-10-20 (×3): qty 50

## 2022-10-20 MED ORDER — POTASSIUM CHLORIDE 10 MEQ/100ML IV SOLN
10.0000 meq | INTRAVENOUS | Status: DC
  Filled 2022-10-20: qty 100

## 2022-10-20 MED ORDER — POTASSIUM CHLORIDE 10 MEQ/50ML IV SOLN
10.0000 meq | INTRAVENOUS | Status: AC
  Administered 2022-10-20 (×2): 10 meq via INTRAVENOUS
  Filled 2022-10-20 (×2): qty 50

## 2022-10-20 MED ORDER — POTASSIUM CHLORIDE 10 MEQ/50ML IV SOLN
10.0000 meq | INTRAVENOUS | Status: AC
  Administered 2022-10-20 (×6): 10 meq via INTRAVENOUS
  Filled 2022-10-20 (×6): qty 50

## 2022-10-20 MED ORDER — SODIUM BICARBONATE 8.4 % IV SOLN
50.0000 meq | Freq: Once | INTRAVENOUS | Status: AC
  Administered 2022-10-20: 50 meq via INTRAVENOUS
  Filled 2022-10-20: qty 50

## 2022-10-20 MED ORDER — WHITE PETROLATUM EX OINT
TOPICAL_OINTMENT | CUTANEOUS | Status: DC | PRN
  Filled 2022-10-20: qty 28.35

## 2022-10-20 MED ORDER — PROSOURCE TF20 ENFIT COMPATIBL EN LIQD
60.0000 mL | Freq: Every day | ENTERAL | Status: DC
  Administered 2022-10-20 – 2022-10-28 (×8): 60 mL
  Filled 2022-10-20 (×9): qty 60

## 2022-10-20 MED ORDER — MIDAZOLAM HCL 2 MG/2ML IJ SOLN
1.0000 mg | INTRAMUSCULAR | Status: DC | PRN
  Administered 2022-10-20: 1 mg via INTRAVENOUS
  Filled 2022-10-20: qty 2

## 2022-10-20 MED ORDER — VITAL 1.5 CAL PO LIQD
1000.0000 mL | ORAL | Status: DC
  Administered 2022-10-20 – 2022-10-28 (×8): 1000 mL
  Filled 2022-10-20 (×3): qty 1000

## 2022-10-20 MED ORDER — POTASSIUM PHOSPHATES 15 MMOLE/5ML IV SOLN
30.0000 mmol | Freq: Once | INTRAVENOUS | Status: AC
  Administered 2022-10-20: 30 mmol via INTRAVENOUS
  Filled 2022-10-20: qty 10

## 2022-10-21 DIAGNOSIS — K769 Liver disease, unspecified: Secondary | ICD-10-CM

## 2022-10-21 LAB — CBC
HCT: 20.9 % — ABNORMAL LOW (ref 39.0–52.0)
HCT: 21.6 % — ABNORMAL LOW (ref 39.0–52.0)
Hemoglobin: 7.6 g/dL — ABNORMAL LOW (ref 13.0–17.0)
Hemoglobin: 7.9 g/dL — ABNORMAL LOW (ref 13.0–17.0)
MCH: 31.3 pg (ref 26.0–34.0)
MCH: 31.9 pg (ref 26.0–34.0)
MCHC: 36.4 g/dL — ABNORMAL HIGH (ref 30.0–36.0)
MCHC: 36.6 g/dL — ABNORMAL HIGH (ref 30.0–36.0)
MCV: 86 fL (ref 80.0–100.0)
MCV: 87.1 fL (ref 80.0–100.0)
Platelets: 34 10*3/uL — ABNORMAL LOW (ref 150–400)
Platelets: 31 10*3/uL — ABNORMAL LOW (ref 150–400)
RBC: 2.43 MIL/uL — ABNORMAL LOW (ref 4.22–5.81)
RBC: 2.48 MIL/uL — ABNORMAL LOW (ref 4.22–5.81)
RDW: 16.7 % — ABNORMAL HIGH (ref 11.5–15.5)
RDW: 17.1 % — ABNORMAL HIGH (ref 11.5–15.5)
WBC: 6.6 10*3/uL (ref 4.0–10.5)
WBC: 7.2 10*3/uL (ref 4.0–10.5)
nRBC: 0.5 % — ABNORMAL HIGH (ref 0.0–0.2)
nRBC: 0.6 % — ABNORMAL HIGH (ref 0.0–0.2)

## 2022-10-21 LAB — COMPREHENSIVE METABOLIC PANEL
ALT: 45 U/L — ABNORMAL HIGH (ref 0–44)
AST: 107 U/L — ABNORMAL HIGH (ref 15–41)
Albumin: 3.4 g/dL — ABNORMAL LOW (ref 3.5–5.0)
Alkaline Phosphatase: 49 U/L (ref 38–126)
Anion gap: 16 — ABNORMAL HIGH (ref 5–15)
BUN: 15 mg/dL (ref 6–20)
CO2: 17 mmol/L — ABNORMAL LOW (ref 22–32)
Calcium: 9.8 mg/dL (ref 8.9–10.3)
Chloride: 104 mmol/L (ref 98–111)
Creatinine, Ser: 1.81 mg/dL — ABNORMAL HIGH (ref 0.61–1.24)
GFR, Estimated: 52 mL/min — ABNORMAL LOW (ref >60)
Glucose, Bld: 79 mg/dL (ref 70–99)
Potassium: 2.5 mmol/L — CL (ref 3.5–5.1)
Sodium: 137 mmol/L (ref 135–145)
Total Bilirubin: 24.2 mg/dL (ref 0.3–1.2)
Total Protein: 6.5 g/dL (ref 6.5–8.1)

## 2022-10-21 LAB — DIC (DISSEMINATED INTRAVASCULAR COAGULATION)PANEL
D-Dimer, Quant: 6.89 ug/mL-FEU — ABNORMAL HIGH (ref 0.00–0.50)
D-Dimer, Quant: 7.58 ug/mL-FEU — ABNORMAL HIGH (ref 0.00–0.50)
Fibrinogen: 156 mg/dL — ABNORMAL LOW (ref 210–475)
Fibrinogen: 193 mg/dL — ABNORMAL LOW (ref 210–475)
INR: 3.5 — ABNORMAL HIGH (ref 0.8–1.2)
INR: 3.1 — ABNORMAL HIGH (ref 0.8–1.2)
Platelets: 34 10*3/uL — ABNORMAL LOW (ref 150–400)
Platelets: 33 10*3/uL — ABNORMAL LOW (ref 150–400)
Prothrombin Time: 34.5 seconds — ABNORMAL HIGH (ref 11.4–15.2)
Prothrombin Time: 31.9 seconds — ABNORMAL HIGH (ref 11.4–15.2)
Smear Review: NONE SEEN
Smear Review: NONE SEEN
aPTT: 60 seconds — ABNORMAL HIGH (ref 24–36)
aPTT: 57 seconds — ABNORMAL HIGH (ref 24–36)

## 2022-10-21 LAB — MISC LABCORP TEST (SEND OUT): Labcorp test code: 6627

## 2022-10-21 LAB — BASIC METABOLIC PANEL
Anion gap: 11 (ref 5–15)
Anion gap: 11 (ref 5–15)
BUN: 15 mg/dL (ref 6–20)
BUN: 17 mg/dL (ref 6–20)
CO2: 19 mmol/L — ABNORMAL LOW (ref 22–32)
CO2: 19 mmol/L — ABNORMAL LOW (ref 22–32)
Calcium: 9.7 mg/dL (ref 8.9–10.3)
Calcium: 10.2 mg/dL (ref 8.9–10.3)
Chloride: 110 mmol/L (ref 98–111)
Chloride: 112 mmol/L — ABNORMAL HIGH (ref 98–111)
Creatinine, Ser: 1.64 mg/dL — ABNORMAL HIGH (ref 0.61–1.24)
Creatinine, Ser: 1.5 mg/dL — ABNORMAL HIGH (ref 0.61–1.24)
GFR, Estimated: 58 mL/min — ABNORMAL LOW (ref >60)
GFR, Estimated: >60 mL/min (ref >60)
Glucose, Bld: 77 mg/dL (ref 70–99)
Glucose, Bld: 118 mg/dL — ABNORMAL HIGH (ref 70–99)
Potassium: 2.9 mmol/L — ABNORMAL LOW (ref 3.5–5.1)
Potassium: 3 mmol/L — ABNORMAL LOW (ref 3.5–5.1)
Sodium: 140 mmol/L (ref 135–145)
Sodium: 142 mmol/L (ref 135–145)

## 2022-10-21 LAB — CULTURE, BLOOD (ROUTINE X 2): Special Requests: ADEQUATE

## 2022-10-21 LAB — TYPE AND SCREEN
ABO/RH(D): O POS
Antibody Screen: NEGATIVE
Unit division: 0
Unit division: 0

## 2022-10-21 LAB — BPAM RBC
Blood Product Expiration Date: 202403272359
Blood Product Expiration Date: 202403282359
ISSUE DATE / TIME: 202402241838
ISSUE DATE / TIME: 202402260609
Unit Type and Rh: 5100
Unit Type and Rh: 5100

## 2022-10-21 LAB — AFP TUMOR MARKER: AFP, Serum, Tumor Marker: 4.6 ng/mL (ref 0.0–5.7)

## 2022-10-21 LAB — GLUCOSE, CAPILLARY
Glucose-Capillary: 84 mg/dL (ref 70–99)
Glucose-Capillary: 75 mg/dL (ref 70–99)
Glucose-Capillary: 76 mg/dL (ref 70–99)
Glucose-Capillary: 83 mg/dL (ref 70–99)
Glucose-Capillary: 117 mg/dL — ABNORMAL HIGH (ref 70–99)
Glucose-Capillary: 107 mg/dL — ABNORMAL HIGH (ref 70–99)
Glucose-Capillary: 102 mg/dL — ABNORMAL HIGH (ref 70–99)

## 2022-10-21 LAB — MAGNESIUM
Magnesium: 2.6 mg/dL — ABNORMAL HIGH (ref 1.7–2.4)
Magnesium: 2.5 mg/dL — ABNORMAL HIGH (ref 1.7–2.4)

## 2022-10-21 LAB — PHOSPHORUS
Phosphorus: 2.8 mg/dL (ref 2.5–4.6)
Phosphorus: 2.5 mg/dL (ref 2.5–4.6)

## 2022-10-21 LAB — PROTIME-INR
INR: 3.5 — ABNORMAL HIGH (ref 0.8–1.2)
Prothrombin Time: 34.9 seconds — ABNORMAL HIGH (ref 11.4–15.2)

## 2022-10-21 LAB — EPSTEIN BARR VRS(EBV DNA BY PCR): EBV DNA QN by PCR: NEGATIVE IU/mL

## 2022-10-21 MED ORDER — POTASSIUM CHLORIDE 20 MEQ PO PACK
40.0000 meq | PACK | Freq: Once | ORAL | Status: AC
  Administered 2022-10-21: 40 meq
  Filled 2022-10-21: qty 2

## 2022-10-21 MED ORDER — POTASSIUM CHLORIDE 10 MEQ/50ML IV SOLN
10.0000 meq | INTRAVENOUS | Status: AC
  Administered 2022-10-21 (×6): 10 meq via INTRAVENOUS
  Filled 2022-10-21 (×6): qty 50

## 2022-10-21 MED ORDER — THIAMINE MONONITRATE 100 MG PO TABS
100.0000 mg | ORAL_TABLET | Freq: Every day | ORAL | Status: DC
  Administered 2022-10-21 – 2022-10-26 (×6): 100 mg
  Filled 2022-10-21 (×6): qty 1

## 2022-10-21 MED ORDER — SODIUM CHLORIDE 0.9 % IV SOLN
2.0000 g | INTRAVENOUS | Status: AC
  Administered 2022-10-21 – 2022-10-22 (×2): 2 g via INTRAVENOUS
  Filled 2022-10-21 (×2): qty 20

## 2022-10-21 MED ORDER — FOLIC ACID 1 MG PO TABS
1.0000 mg | ORAL_TABLET | Freq: Every day | ORAL | Status: DC
  Administered 2022-10-21 – 2022-10-24 (×4): 1 mg
  Filled 2022-10-21 (×4): qty 1

## 2022-10-21 MED ORDER — METRONIDAZOLE 500 MG PO TABS
500.0000 mg | ORAL_TABLET | Freq: Two times a day (BID) | ORAL | Status: DC
  Administered 2022-10-21 – 2022-10-22 (×2): 500 mg
  Filled 2022-10-21 (×3): qty 1

## 2022-10-21 MED ORDER — FUROSEMIDE 10 MG/ML IJ SOLN
40.0000 mg | Freq: Once | INTRAMUSCULAR | Status: AC
  Administered 2022-10-21: 40 mg via INTRAVENOUS
  Filled 2022-10-21: qty 4

## 2022-10-21 MED ORDER — POTASSIUM CHLORIDE 10 MEQ/100ML IV SOLN
10.0000 meq | INTRAVENOUS | Status: AC
  Administered 2022-10-21 (×4): 10 meq via INTRAVENOUS
  Filled 2022-10-21 (×4): qty 100

## 2022-10-21 MED ORDER — VITAMIN D 25 MCG (1000 UNIT) PO TABS
1000.0000 [IU] | ORAL_TABLET | Freq: Every day | ORAL | Status: DC
  Administered 2022-10-21 – 2022-10-28 (×8): 1000 [IU]
  Filled 2022-10-21 (×8): qty 1

## 2022-10-22 ENCOUNTER — Inpatient Hospital Stay (HOSPITAL_COMMUNITY): Payer: Self-pay

## 2022-10-22 LAB — COMPREHENSIVE METABOLIC PANEL
ALT: 43 U/L (ref 0–44)
AST: 96 U/L — ABNORMAL HIGH (ref 15–41)
Albumin: 3 g/dL — ABNORMAL LOW (ref 3.5–5.0)
Alkaline Phosphatase: 74 U/L (ref 38–126)
Anion gap: 11 (ref 5–15)
BUN: 20 mg/dL (ref 6–20)
CO2: 19 mmol/L — ABNORMAL LOW (ref 22–32)
Calcium: 10.2 mg/dL (ref 8.9–10.3)
Chloride: 116 mmol/L — ABNORMAL HIGH (ref 98–111)
Creatinine, Ser: 1.33 mg/dL — ABNORMAL HIGH (ref 0.61–1.24)
GFR, Estimated: >60 mL/min (ref >60)
Glucose, Bld: 131 mg/dL — ABNORMAL HIGH (ref 70–99)
Potassium: 3.2 mmol/L — ABNORMAL LOW (ref 3.5–5.1)
Sodium: 146 mmol/L — ABNORMAL HIGH (ref 135–145)
Total Bilirubin: 29.4 mg/dL (ref 0.3–1.2)
Total Protein: 6.2 g/dL — ABNORMAL LOW (ref 6.5–8.1)

## 2022-10-22 LAB — CBC
HCT: 22.4 % — ABNORMAL LOW (ref 39.0–52.0)
HCT: 24.6 % — ABNORMAL LOW (ref 39.0–52.0)
Hemoglobin: 8.1 g/dL — ABNORMAL LOW (ref 13.0–17.0)
Hemoglobin: 8.6 g/dL — ABNORMAL LOW (ref 13.0–17.0)
MCH: 32.9 pg (ref 26.0–34.0)
MCH: 32.2 pg (ref 26.0–34.0)
MCHC: 36.2 g/dL — ABNORMAL HIGH (ref 30.0–36.0)
MCHC: 35 g/dL (ref 30.0–36.0)
MCV: 91.1 fL (ref 80.0–100.0)
MCV: 92.1 fL (ref 80.0–100.0)
Platelets: 31 10*3/uL — ABNORMAL LOW (ref 150–400)
Platelets: 32 10*3/uL — ABNORMAL LOW (ref 150–400)
RBC: 2.46 MIL/uL — ABNORMAL LOW (ref 4.22–5.81)
RBC: 2.67 MIL/uL — ABNORMAL LOW (ref 4.22–5.81)
RDW: 18.1 % — ABNORMAL HIGH (ref 11.5–15.5)
RDW: 18.5 % — ABNORMAL HIGH (ref 11.5–15.5)
WBC: 8.5 10*3/uL (ref 4.0–10.5)
WBC: 7.5 10*3/uL (ref 4.0–10.5)
nRBC: 0.4 % — ABNORMAL HIGH (ref 0.0–0.2)
nRBC: 0.4 % — ABNORMAL HIGH (ref 0.0–0.2)

## 2022-10-22 LAB — GLUCOSE, CAPILLARY
Glucose-Capillary: 102 mg/dL — ABNORMAL HIGH (ref 70–99)
Glucose-Capillary: 123 mg/dL — ABNORMAL HIGH (ref 70–99)
Glucose-Capillary: 131 mg/dL — ABNORMAL HIGH (ref 70–99)
Glucose-Capillary: 88 mg/dL (ref 70–99)
Glucose-Capillary: 136 mg/dL — ABNORMAL HIGH (ref 70–99)
Glucose-Capillary: 109 mg/dL — ABNORMAL HIGH (ref 70–99)

## 2022-10-22 LAB — HSV DNA BY PCR (REFERENCE LAB)
HSV 1 DNA: NEGATIVE
HSV 2 DNA: NEGATIVE

## 2022-10-22 LAB — DIC (DISSEMINATED INTRAVASCULAR COAGULATION)PANEL
D-Dimer, Quant: 10.17 ug/mL-FEU — ABNORMAL HIGH (ref 0.00–0.50)
Fibrinogen: 212 mg/dL (ref 210–475)
INR: 2.9 — ABNORMAL HIGH (ref 0.8–1.2)
Platelets: 33 10*3/uL — ABNORMAL LOW (ref 150–400)
Prothrombin Time: 29.7 seconds — ABNORMAL HIGH (ref 11.4–15.2)
Smear Review: NONE SEEN
aPTT: 53 seconds — ABNORMAL HIGH (ref 24–36)

## 2022-10-22 LAB — MAGNESIUM
Magnesium: 2.5 mg/dL — ABNORMAL HIGH (ref 1.7–2.4)
Magnesium: 2.6 mg/dL — ABNORMAL HIGH (ref 1.7–2.4)

## 2022-10-22 LAB — LACTIC ACID, PLASMA: Lactic Acid, Venous: 2.6 mmol/L (ref 0.5–1.9)

## 2022-10-22 LAB — PHOSPHORUS
Phosphorus: 2.5 mg/dL (ref 2.5–4.6)
Phosphorus: 2.4 mg/dL — ABNORMAL LOW (ref 2.5–4.6)

## 2022-10-22 LAB — PROTIME-INR
INR: 3.1 — ABNORMAL HIGH (ref 0.8–1.2)
Prothrombin Time: 31.7 seconds — ABNORMAL HIGH (ref 11.4–15.2)

## 2022-10-22 MED ORDER — GADOBUTROL 1 MMOL/ML IV SOLN
7.0000 mL | Freq: Once | INTRAVENOUS | Status: AC | PRN
  Administered 2022-10-22: 7 mL via INTRAVENOUS

## 2022-10-22 MED ORDER — METRONIDAZOLE 500 MG PO TABS
500.0000 mg | ORAL_TABLET | Freq: Two times a day (BID) | ORAL | Status: AC
  Administered 2022-10-22 – 2022-10-24 (×5): 500 mg
  Filled 2022-10-22 (×5): qty 1

## 2022-10-23 ENCOUNTER — Inpatient Hospital Stay (HOSPITAL_COMMUNITY): Payer: Self-pay

## 2022-10-23 LAB — COMPREHENSIVE METABOLIC PANEL
ALT: 37 U/L (ref 0–44)
AST: 98 U/L — ABNORMAL HIGH (ref 15–41)
Albumin: 2.8 g/dL — ABNORMAL LOW (ref 3.5–5.0)
Alkaline Phosphatase: 104 U/L (ref 38–126)
Anion gap: 10 (ref 5–15)
BUN: 28 mg/dL — ABNORMAL HIGH (ref 6–20)
CO2: 19 mmol/L — ABNORMAL LOW (ref 22–32)
Calcium: 10.4 mg/dL — ABNORMAL HIGH (ref 8.9–10.3)
Chloride: 119 mmol/L — ABNORMAL HIGH (ref 98–111)
Creatinine, Ser: 1.04 mg/dL (ref 0.61–1.24)
GFR, Estimated: >60 mL/min (ref >60)
Glucose, Bld: 116 mg/dL — ABNORMAL HIGH (ref 70–99)
Potassium: 3.4 mmol/L — ABNORMAL LOW (ref 3.5–5.1)
Sodium: 148 mmol/L — ABNORMAL HIGH (ref 135–145)
Total Bilirubin: 32.8 mg/dL (ref 0.3–1.2)
Total Protein: 6.4 g/dL — ABNORMAL LOW (ref 6.5–8.1)

## 2022-10-23 LAB — CBC
HCT: 22.4 % — ABNORMAL LOW (ref 39.0–52.0)
Hemoglobin: 7.8 g/dL — ABNORMAL LOW (ref 13.0–17.0)
MCH: 32.2 pg (ref 26.0–34.0)
MCHC: 34.8 g/dL (ref 30.0–36.0)
MCV: 92.6 fL (ref 80.0–100.0)
Platelets: 34 10*3/uL — ABNORMAL LOW (ref 150–400)
RBC: 2.42 MIL/uL — ABNORMAL LOW (ref 4.22–5.81)
RDW: 18.3 % — ABNORMAL HIGH (ref 11.5–15.5)
WBC: 9 10*3/uL (ref 4.0–10.5)
nRBC: 0.7 % — ABNORMAL HIGH (ref 0.0–0.2)

## 2022-10-23 LAB — GASTROINTESTINAL PANEL BY PCR, STOOL (REPLACES STOOL CULTURE)

## 2022-10-23 LAB — GLUCOSE, CAPILLARY
Glucose-Capillary: 101 mg/dL — ABNORMAL HIGH (ref 70–99)
Glucose-Capillary: 130 mg/dL — ABNORMAL HIGH (ref 70–99)
Glucose-Capillary: 131 mg/dL — ABNORMAL HIGH (ref 70–99)
Glucose-Capillary: 88 mg/dL (ref 70–99)
Glucose-Capillary: 92 mg/dL (ref 70–99)
Glucose-Capillary: 98 mg/dL (ref 70–99)

## 2022-10-23 LAB — PROTIME-INR
INR: 2.7 — ABNORMAL HIGH (ref 0.8–1.2)
Prothrombin Time: 28.5 seconds — ABNORMAL HIGH (ref 11.4–15.2)

## 2022-10-23 MED ORDER — POTASSIUM CHLORIDE 20 MEQ PO PACK
40.0000 meq | PACK | Freq: Once | ORAL | Status: AC
  Administered 2022-10-23: 40 meq
  Filled 2022-10-23: qty 2

## 2022-10-23 MED ORDER — SPIRONOLACTONE 25 MG PO TABS
50.0000 mg | ORAL_TABLET | Freq: Every day | ORAL | Status: DC
  Administered 2022-10-23 – 2022-10-26 (×4): 50 mg
  Filled 2022-10-23 (×4): qty 2

## 2022-10-23 MED ORDER — SPIRONOLACTONE 25 MG PO TABS: 50.0000 mg | ORAL_TABLET | Freq: Every day | ORAL | Status: DC

## 2022-10-23 MED ORDER — FUROSEMIDE 20 MG PO TABS: 20.0000 mg | ORAL_TABLET | Freq: Every day | ORAL | Status: DC

## 2022-10-23 MED ORDER — FUROSEMIDE 20 MG PO TABS
20.0000 mg | ORAL_TABLET | Freq: Every day | ORAL | Status: DC
  Administered 2022-10-23 – 2022-10-24 (×2): 20 mg
  Filled 2022-10-23 (×2): qty 1

## 2022-10-23 MED ORDER — LORAZEPAM 2 MG/ML IJ SOLN: 1.0000 mg | Freq: Once | INTRAMUSCULAR | Status: AC

## 2022-10-23 MED ORDER — POTASSIUM CHLORIDE 20 MEQ PO PACK
40.0000 meq | PACK | Freq: Every day | ORAL | Status: AC
  Administered 2022-10-23 – 2022-10-25 (×3): 40 meq
  Filled 2022-10-23 (×3): qty 2

## 2022-10-23 MED ORDER — MIDODRINE HCL 5 MG PO TABS: 5.0000 mg | ORAL_TABLET | Freq: Three times a day (TID) | ORAL | Status: DC

## 2022-10-23 MED ORDER — LORAZEPAM 2 MG/ML IJ SOLN
INTRAMUSCULAR | Status: AC
  Administered 2022-10-23: 1 mg via INTRAVENOUS
  Filled 2022-10-23: qty 1

## 2022-10-23 MED ORDER — ORAL CARE MOUTH RINSE: 15.0000 mL | OROMUCOSAL | Status: DC | PRN

## 2022-10-23 MED ORDER — ORAL CARE MOUTH RINSE
15.0000 mL | OROMUCOSAL | Status: DC
  Administered 2022-10-23 – 2022-10-24 (×4): 15 mL via OROMUCOSAL

## 2022-10-23 MED ORDER — MIDODRINE HCL 5 MG PO TABS
5.0000 mg | ORAL_TABLET | Freq: Three times a day (TID) | ORAL | Status: DC
  Administered 2022-10-23 – 2022-11-01 (×24): 5 mg
  Filled 2022-10-23 (×25): qty 1

## 2022-10-24 ENCOUNTER — Inpatient Hospital Stay (HOSPITAL_COMMUNITY): Payer: Self-pay

## 2022-10-24 DIAGNOSIS — E876 Hypokalemia: Secondary | ICD-10-CM

## 2022-10-24 DIAGNOSIS — K7031 Alcoholic cirrhosis of liver with ascites: Secondary | ICD-10-CM

## 2022-10-24 DIAGNOSIS — E871 Hypo-osmolality and hyponatremia: Secondary | ICD-10-CM

## 2022-10-24 LAB — COMPREHENSIVE METABOLIC PANEL
ALT: 53 U/L — ABNORMAL HIGH (ref 0–44)
AST: 146 U/L — ABNORMAL HIGH (ref 15–41)
Albumin: 2.7 g/dL — ABNORMAL LOW (ref 3.5–5.0)
Alkaline Phosphatase: 130 U/L — ABNORMAL HIGH (ref 38–126)
Anion gap: 9 (ref 5–15)
BUN: 35 mg/dL — ABNORMAL HIGH (ref 6–20)
CO2: 17 mmol/L — ABNORMAL LOW (ref 22–32)
Calcium: 10.2 mg/dL (ref 8.9–10.3)
Chloride: 123 mmol/L — ABNORMAL HIGH (ref 98–111)
Creatinine, Ser: 0.96 mg/dL (ref 0.61–1.24)
GFR, Estimated: >60 mL/min (ref >60)
Glucose, Bld: 105 mg/dL — ABNORMAL HIGH (ref 70–99)
Potassium: 4 mmol/L (ref 3.5–5.1)
Sodium: 149 mmol/L — ABNORMAL HIGH (ref 135–145)
Total Bilirubin: 38.7 mg/dL (ref 0.3–1.2)
Total Protein: 7.2 g/dL (ref 6.5–8.1)

## 2022-10-24 LAB — CBC
HCT: 27.2 % — ABNORMAL LOW (ref 39.0–52.0)
Hemoglobin: 9.1 g/dL — ABNORMAL LOW (ref 13.0–17.0)
MCH: 32.9 pg (ref 26.0–34.0)
MCHC: 33.5 g/dL (ref 30.0–36.0)
MCV: 98.2 fL (ref 80.0–100.0)
Platelets: 34 10*3/uL — ABNORMAL LOW (ref 150–400)
RBC: 2.77 MIL/uL — ABNORMAL LOW (ref 4.22–5.81)
RDW: 20 % — ABNORMAL HIGH (ref 11.5–15.5)
WBC: 10.3 10*3/uL (ref 4.0–10.5)
nRBC: 0.8 % — ABNORMAL HIGH (ref 0.0–0.2)

## 2022-10-24 LAB — CULTURE, BLOOD (ROUTINE X 2): Special Requests: ADEQUATE

## 2022-10-24 LAB — GLUCOSE, CAPILLARY
Glucose-Capillary: 106 mg/dL — ABNORMAL HIGH (ref 70–99)
Glucose-Capillary: 85 mg/dL (ref 70–99)
Glucose-Capillary: 93 mg/dL (ref 70–99)
Glucose-Capillary: 97 mg/dL (ref 70–99)
Glucose-Capillary: 98 mg/dL (ref 70–99)
Glucose-Capillary: 98 mg/dL (ref 70–99)

## 2022-10-24 LAB — OSMOLALITY, URINE: Osmolality, Ur: 422 mOsm/kg (ref 300–900)

## 2022-10-24 LAB — AMMONIA: Ammonia: 58 umol/L — ABNORMAL HIGH (ref 9–35)

## 2022-10-24 LAB — SODIUM, URINE, RANDOM: Sodium, Ur: 20 mmol/L

## 2022-10-24 LAB — CMV DNA, QUANTITATIVE, PCR
CMV DNA Quant: NEGATIVE IU/mL
Log10 CMV Qn DNA Pl: UNDETERMINED log10 IU/mL

## 2022-10-24 LAB — GAMMA GT: GGT: 261 U/L — ABNORMAL HIGH (ref 7–50)

## 2022-10-24 LAB — ZINC: Zinc: 18 ug/dL — ABNORMAL LOW (ref 44–115)

## 2022-10-24 MED ORDER — LORAZEPAM 1 MG PO TABS
1.0000 mg | ORAL_TABLET | ORAL | Status: DC | PRN
  Administered 2022-10-25: 2 mg via ORAL
  Filled 2022-10-24: qty 2

## 2022-10-24 MED ORDER — LORAZEPAM 2 MG/ML IJ SOLN
1.0000 mg | INTRAMUSCULAR | Status: DC | PRN
  Administered 2022-10-24 – 2022-10-26 (×4): 2 mg via INTRAVENOUS
  Filled 2022-10-24 (×4): qty 1

## 2022-10-24 MED ORDER — FUROSEMIDE 10 MG/ML IJ SOLN
40.0000 mg | Freq: Two times a day (BID) | INTRAMUSCULAR | Status: DC
  Administered 2022-10-24 – 2022-10-26 (×4): 40 mg via INTRAVENOUS
  Filled 2022-10-24 (×4): qty 4

## 2022-10-24 MED ORDER — FUROSEMIDE 10 MG/ML IJ SOLN: 40.0000 mg | Freq: Two times a day (BID) | INTRAMUSCULAR | Status: DC

## 2022-10-24 MED ORDER — ORAL CARE MOUTH RINSE
15.0000 mL | OROMUCOSAL | Status: DC
  Administered 2022-10-24 – 2022-11-01 (×31): 15 mL via OROMUCOSAL

## 2022-10-24 MED ORDER — ORAL CARE MOUTH RINSE: 15.0000 mL | OROMUCOSAL | Status: DC | PRN

## 2022-10-24 DEATH — deceased

## 2022-10-25 LAB — GLUCOSE, CAPILLARY
Glucose-Capillary: 128 mg/dL — ABNORMAL HIGH (ref 70–99)
Glucose-Capillary: 114 mg/dL — ABNORMAL HIGH (ref 70–99)
Glucose-Capillary: 124 mg/dL — ABNORMAL HIGH (ref 70–99)
Glucose-Capillary: 130 mg/dL — ABNORMAL HIGH (ref 70–99)
Glucose-Capillary: 110 mg/dL — ABNORMAL HIGH (ref 70–99)
Glucose-Capillary: 138 mg/dL — ABNORMAL HIGH (ref 70–99)

## 2022-10-25 LAB — COMPREHENSIVE METABOLIC PANEL
ALT: 55 U/L — ABNORMAL HIGH (ref 0–44)
AST: 152 U/L — ABNORMAL HIGH (ref 15–41)
Albumin: 2.7 g/dL — ABNORMAL LOW (ref 3.5–5.0)
Alkaline Phosphatase: 138 U/L — ABNORMAL HIGH (ref 38–126)
Anion gap: 8 (ref 5–15)
BUN: 34 mg/dL — ABNORMAL HIGH (ref 6–20)
CO2: 20 mmol/L — ABNORMAL LOW (ref 22–32)
Calcium: 10.6 mg/dL — ABNORMAL HIGH (ref 8.9–10.3)
Chloride: 125 mmol/L — ABNORMAL HIGH (ref 98–111)
Creatinine, Ser: 0.85 mg/dL (ref 0.61–1.24)
GFR, Estimated: >60 mL/min (ref >60)
Glucose, Bld: 115 mg/dL — ABNORMAL HIGH (ref 70–99)
Potassium: 3.4 mmol/L — ABNORMAL LOW (ref 3.5–5.1)
Sodium: 153 mmol/L — ABNORMAL HIGH (ref 135–145)
Total Bilirubin: 41.6 mg/dL (ref 0.3–1.2)
Total Protein: 7.6 g/dL (ref 6.5–8.1)

## 2022-10-25 LAB — CBC
HCT: 26.6 % — ABNORMAL LOW (ref 39.0–52.0)
Hemoglobin: 9.2 g/dL — ABNORMAL LOW (ref 13.0–17.0)
MCH: 32.9 pg (ref 26.0–34.0)
MCHC: 34.6 g/dL (ref 30.0–36.0)
MCV: 95 fL (ref 80.0–100.0)
Platelets: 37 10*3/uL — ABNORMAL LOW (ref 150–400)
RBC: 2.8 MIL/uL — ABNORMAL LOW (ref 4.22–5.81)
RDW: 20.6 % — ABNORMAL HIGH (ref 11.5–15.5)
WBC: 12.8 10*3/uL — ABNORMAL HIGH (ref 4.0–10.5)
nRBC: 1.1 % — ABNORMAL HIGH (ref 0.0–0.2)

## 2022-10-25 LAB — PROTIME-INR
INR: 2.3 — ABNORMAL HIGH (ref 0.8–1.2)
Prothrombin Time: 25.4 seconds — ABNORMAL HIGH (ref 11.4–15.2)

## 2022-10-25 LAB — ANTI-SMOOTH MUSCLE ANTIBODY, IGG: F-Actin IgG: 44 U — ABNORMAL HIGH (ref 0–19)

## 2022-10-25 MED ORDER — FREE WATER
200.0000 mL | Status: DC
  Administered 2022-10-25 – 2022-10-26 (×4): 200 mL

## 2022-10-25 MED ORDER — METOLAZONE 5 MG PO TABS
5.0000 mg | ORAL_TABLET | Freq: Once | ORAL | Status: AC
  Administered 2022-10-25: 5 mg
  Filled 2022-10-25: qty 1

## 2022-10-26 DIAGNOSIS — D684 Acquired coagulation factor deficiency: Secondary | ICD-10-CM

## 2022-10-26 DIAGNOSIS — Z7189 Other specified counseling: Secondary | ICD-10-CM

## 2022-10-26 DIAGNOSIS — E7220 Disorder of urea cycle metabolism, unspecified: Secondary | ICD-10-CM

## 2022-10-26 DIAGNOSIS — Z515 Encounter for palliative care: Secondary | ICD-10-CM

## 2022-10-26 LAB — BASIC METABOLIC PANEL
Anion gap: 7 (ref 5–15)
BUN: 34 mg/dL — ABNORMAL HIGH (ref 6–20)
CO2: 22 mmol/L (ref 22–32)
Calcium: 11.8 mg/dL — ABNORMAL HIGH (ref 8.9–10.3)
Chloride: 130 mmol/L — ABNORMAL HIGH (ref 98–111)
Creatinine, Ser: 0.92 mg/dL (ref 0.61–1.24)
GFR, Estimated: >60 mL/min (ref >60)
Glucose, Bld: 144 mg/dL — ABNORMAL HIGH (ref 70–99)
Potassium: 3.2 mmol/L — ABNORMAL LOW (ref 3.5–5.1)
Sodium: 159 mmol/L — ABNORMAL HIGH (ref 135–145)

## 2022-10-26 LAB — CBC
HCT: 29.8 % — ABNORMAL LOW (ref 39.0–52.0)
Hemoglobin: 9.8 g/dL — ABNORMAL LOW (ref 13.0–17.0)
MCH: 32.5 pg (ref 26.0–34.0)
MCHC: 32.9 g/dL (ref 30.0–36.0)
MCV: 98.7 fL (ref 80.0–100.0)
Platelets: 41 10*3/uL — ABNORMAL LOW (ref 150–400)
RBC: 3.02 MIL/uL — ABNORMAL LOW (ref 4.22–5.81)
RDW: 22.7 % — ABNORMAL HIGH (ref 11.5–15.5)
WBC: 16.7 10*3/uL — ABNORMAL HIGH (ref 4.0–10.5)
nRBC: 1.3 % — ABNORMAL HIGH (ref 0.0–0.2)

## 2022-10-26 LAB — HEPATIC FUNCTION PANEL
ALT: 65 U/L — ABNORMAL HIGH (ref 0–44)
AST: 173 U/L — ABNORMAL HIGH (ref 15–41)
Albumin: 2.7 g/dL — ABNORMAL LOW (ref 3.5–5.0)
Alkaline Phosphatase: 164 U/L — ABNORMAL HIGH (ref 38–126)
Bilirubin, Direct: 28.7 mg/dL — ABNORMAL HIGH (ref 0.0–0.2)
Indirect Bilirubin: 16.6 mg/dL — ABNORMAL HIGH (ref 0.3–0.9)
Total Bilirubin: 45.3 mg/dL (ref 0.3–1.2)
Total Protein: 8.1 g/dL (ref 6.5–8.1)

## 2022-10-26 LAB — GLUCOSE, CAPILLARY
Glucose-Capillary: 133 mg/dL — ABNORMAL HIGH (ref 70–99)
Glucose-Capillary: 133 mg/dL — ABNORMAL HIGH (ref 70–99)
Glucose-Capillary: 136 mg/dL — ABNORMAL HIGH (ref 70–99)
Glucose-Capillary: 139 mg/dL — ABNORMAL HIGH (ref 70–99)
Glucose-Capillary: 142 mg/dL — ABNORMAL HIGH (ref 70–99)
Glucose-Capillary: 158 mg/dL — ABNORMAL HIGH (ref 70–99)

## 2022-10-26 LAB — PROTIME-INR
INR: 2.5 — ABNORMAL HIGH (ref 0.8–1.2)
Prothrombin Time: 26.4 seconds — ABNORMAL HIGH (ref 11.4–15.2)

## 2022-10-26 LAB — MAGNESIUM: Magnesium: 2.6 mg/dL — ABNORMAL HIGH (ref 1.7–2.4)

## 2022-10-26 LAB — VITAMIN B6

## 2022-10-26 LAB — ANA: Anti Nuclear Antibody (ANA): NEGATIVE

## 2022-10-26 LAB — PHOSPHORUS: Phosphorus: 4.9 mg/dL — ABNORMAL HIGH (ref 2.5–4.6)

## 2022-10-26 MED ORDER — POTASSIUM CHLORIDE 10 MEQ/100ML IV SOLN
10.0000 meq | INTRAVENOUS | Status: AC
  Administered 2022-10-26 (×4): 10 meq via INTRAVENOUS
  Filled 2022-10-26 (×4): qty 100

## 2022-10-26 MED ORDER — FREE WATER
400.0000 mL | Status: DC
  Administered 2022-10-26 – 2022-10-31 (×30): 400 mL

## 2022-10-26 MED ORDER — SODIUM CHLORIDE 0.45 % IV SOLN: INTRAVENOUS | Status: DC

## 2022-10-26 MED ORDER — FUROSEMIDE 10 MG/ML IJ SOLN: 40.0000 mg | Freq: Every day | INTRAMUSCULAR | Status: DC

## 2022-10-26 MED ORDER — GERHARDT'S BUTT CREAM
TOPICAL_CREAM | CUTANEOUS | Status: DC | PRN
  Filled 2022-10-26: qty 1

## 2022-10-26 MED ORDER — POTASSIUM CHLORIDE 20 MEQ PO PACK
20.0000 meq | PACK | ORAL | Status: AC
  Administered 2022-10-26 (×2): 20 meq
  Filled 2022-10-26 (×2): qty 1

## 2022-10-27 ENCOUNTER — Inpatient Hospital Stay (HOSPITAL_COMMUNITY): Payer: Self-pay

## 2022-10-27 LAB — BASIC METABOLIC PANEL
Anion gap: 7 (ref 5–15)
BUN: 41 mg/dL — ABNORMAL HIGH (ref 6–20)
CO2: 22 mmol/L (ref 22–32)
Calcium: 11.6 mg/dL — ABNORMAL HIGH (ref 8.9–10.3)
Chloride: 123 mmol/L — ABNORMAL HIGH (ref 98–111)
Creatinine, Ser: 1.39 mg/dL — ABNORMAL HIGH (ref 0.61–1.24)
GFR, Estimated: >60 mL/min (ref >60)
Glucose, Bld: 123 mg/dL — ABNORMAL HIGH (ref 70–99)
Potassium: 3.4 mmol/L — ABNORMAL LOW (ref 3.5–5.1)
Sodium: 152 mmol/L — ABNORMAL HIGH (ref 135–145)

## 2022-10-27 LAB — GLUCOSE, CAPILLARY
Glucose-Capillary: 103 mg/dL — ABNORMAL HIGH (ref 70–99)
Glucose-Capillary: 104 mg/dL — ABNORMAL HIGH (ref 70–99)
Glucose-Capillary: 133 mg/dL — ABNORMAL HIGH (ref 70–99)
Glucose-Capillary: 152 mg/dL — ABNORMAL HIGH (ref 70–99)
Glucose-Capillary: 158 mg/dL — ABNORMAL HIGH (ref 70–99)
Glucose-Capillary: 172 mg/dL — ABNORMAL HIGH (ref 70–99)
Glucose-Capillary: 182 mg/dL — ABNORMAL HIGH (ref 70–99)

## 2022-10-27 LAB — HEPATIC FUNCTION PANEL
ALT: 71 U/L — ABNORMAL HIGH (ref 0–44)
AST: 176 U/L — ABNORMAL HIGH (ref 15–41)
Albumin: 2.4 g/dL — ABNORMAL LOW (ref 3.5–5.0)
Alkaline Phosphatase: 160 U/L — ABNORMAL HIGH (ref 38–126)
Bilirubin, Direct: 26.9 mg/dL — ABNORMAL HIGH (ref 0.0–0.2)
Indirect Bilirubin: 15.5 mg/dL — ABNORMAL HIGH (ref 0.3–0.9)
Total Bilirubin: 42.4 mg/dL (ref 0.3–1.2)
Total Protein: 7.8 g/dL (ref 6.5–8.1)

## 2022-10-27 LAB — CBC
HCT: 29.2 % — ABNORMAL LOW (ref 39.0–52.0)
Hemoglobin: 9.8 g/dL — ABNORMAL LOW (ref 13.0–17.0)
MCH: 33.3 pg (ref 26.0–34.0)
MCHC: 33.6 g/dL (ref 30.0–36.0)
MCV: 99.3 fL (ref 80.0–100.0)
Platelets: UNDETERMINED 10*3/uL (ref 150–400)
RBC: 2.94 MIL/uL — ABNORMAL LOW (ref 4.22–5.81)
RDW: 23.9 % — ABNORMAL HIGH (ref 11.5–15.5)
WBC: 22.1 10*3/uL — ABNORMAL HIGH (ref 4.0–10.5)
nRBC: 2.1 % — ABNORMAL HIGH (ref 0.0–0.2)

## 2022-10-27 LAB — PHOSPHORUS: Phosphorus: 4.5 mg/dL (ref 2.5–4.6)

## 2022-10-27 LAB — PROTIME-INR
INR: 2.5 — ABNORMAL HIGH (ref 0.8–1.2)
Prothrombin Time: 26.9 seconds — ABNORMAL HIGH (ref 11.4–15.2)

## 2022-10-27 LAB — MAGNESIUM: Magnesium: 2.7 mg/dL — ABNORMAL HIGH (ref 1.7–2.4)

## 2022-10-27 LAB — AMMONIA: Ammonia: 30 umol/L (ref 9–35)

## 2022-10-27 MED ORDER — THIAMINE HCL 100 MG/ML IJ SOLN
500.0000 mg | INTRAVENOUS | Status: AC
  Administered 2022-10-27 – 2022-10-29 (×3): 500 mg via INTRAVENOUS
  Filled 2022-10-27 (×3): qty 5

## 2022-10-27 MED ORDER — PREDNISOLONE SODIUM PHOSPHATE 15 MG/5ML PO SOLN: 40.0000 mg | Freq: Every day | ORAL | Status: DC

## 2022-10-27 MED ORDER — THIAMINE HCL 100 MG/ML IJ SOLN
100.0000 mg | Freq: Every day | INTRAMUSCULAR | Status: DC
  Administered 2022-10-30 – 2022-11-01 (×3): 100 mg via INTRAVENOUS
  Filled 2022-10-27 (×3): qty 2

## 2022-10-27 MED ORDER — LIDOCAINE HCL 1 % IJ SOLN
INTRAMUSCULAR | Status: AC
  Filled 2022-10-27: qty 20

## 2022-10-27 MED ORDER — POTASSIUM CHLORIDE 20 MEQ PO PACK
40.0000 meq | PACK | ORAL | Status: AC
  Administered 2022-10-27 (×2): 40 meq
  Filled 2022-10-27 (×2): qty 2

## 2022-10-27 MED ORDER — PREDNISOLONE SODIUM PHOSPHATE 15 MG/5ML PO SOLN
40.0000 mg | Freq: Every day | ORAL | Status: DC
  Administered 2022-10-27 – 2022-11-01 (×6): 40 mg
  Filled 2022-10-27 (×7): qty 15

## 2022-10-27 MED ORDER — INSULIN ASPART 100 UNIT/ML IJ SOLN
0.0000 [IU] | INTRAMUSCULAR | Status: DC
  Administered 2022-10-27 – 2022-10-28 (×4): 3 [IU] via SUBCUTANEOUS
  Administered 2022-10-28 – 2022-10-29 (×5): 2 [IU] via SUBCUTANEOUS
  Administered 2022-10-29 (×2): 3 [IU] via SUBCUTANEOUS
  Administered 2022-10-29: 2 [IU] via SUBCUTANEOUS
  Administered 2022-10-29: 3 [IU] via SUBCUTANEOUS
  Administered 2022-10-29 – 2022-10-30 (×5): 2 [IU] via SUBCUTANEOUS
  Administered 2022-10-30: 3 [IU] via SUBCUTANEOUS
  Administered 2022-10-31 (×3): 2 [IU] via SUBCUTANEOUS
  Administered 2022-10-31 (×2): 3 [IU] via SUBCUTANEOUS
  Administered 2022-10-31: 2 [IU] via SUBCUTANEOUS
  Administered 2022-10-31: 3 [IU] via SUBCUTANEOUS
  Administered 2022-11-01: 2 [IU] via SUBCUTANEOUS
  Administered 2022-11-01: 3 [IU] via SUBCUTANEOUS

## 2022-10-27 MED ORDER — ALBUMIN HUMAN 25 % IV SOLN
50.0000 g | Freq: Every day | INTRAVENOUS | Status: DC
  Administered 2022-10-27 – 2022-10-28 (×2): 50 g via INTRAVENOUS
  Administered 2022-10-29: 12.5 g via INTRAVENOUS
  Administered 2022-10-30 – 2022-10-31 (×2): 50 g via INTRAVENOUS
  Filled 2022-10-27 (×5): qty 200

## 2022-10-28 ENCOUNTER — Inpatient Hospital Stay (HOSPITAL_COMMUNITY): Payer: Self-pay

## 2022-10-28 DIAGNOSIS — K7010 Alcoholic hepatitis without ascites: Secondary | ICD-10-CM

## 2022-10-28 LAB — GLUCOSE, CAPILLARY
Glucose-Capillary: 149 mg/dL — ABNORMAL HIGH (ref 70–99)
Glucose-Capillary: 137 mg/dL — ABNORMAL HIGH (ref 70–99)
Glucose-Capillary: 127 mg/dL — ABNORMAL HIGH (ref 70–99)
Glucose-Capillary: 155 mg/dL — ABNORMAL HIGH (ref 70–99)
Glucose-Capillary: 175 mg/dL — ABNORMAL HIGH (ref 70–99)
Glucose-Capillary: 150 mg/dL — ABNORMAL HIGH (ref 70–99)

## 2022-10-28 LAB — BASIC METABOLIC PANEL
Anion gap: 9 (ref 5–15)
BUN: 50 mg/dL — ABNORMAL HIGH (ref 6–20)
CO2: 18 mmol/L — ABNORMAL LOW (ref 22–32)
Calcium: 11.5 mg/dL — ABNORMAL HIGH (ref 8.9–10.3)
Chloride: 124 mmol/L — ABNORMAL HIGH (ref 98–111)
Creatinine, Ser: 1.54 mg/dL — ABNORMAL HIGH (ref 0.61–1.24)
GFR, Estimated: >60 mL/min (ref >60)
Glucose, Bld: 170 mg/dL — ABNORMAL HIGH (ref 70–99)
Potassium: 3.5 mmol/L (ref 3.5–5.1)
Sodium: 151 mmol/L — ABNORMAL HIGH (ref 135–145)

## 2022-10-28 LAB — HEPATIC FUNCTION PANEL
ALT: 64 U/L — ABNORMAL HIGH (ref 0–44)
AST: 148 U/L — ABNORMAL HIGH (ref 15–41)
Albumin: 2.7 g/dL — ABNORMAL LOW (ref 3.5–5.0)
Alkaline Phosphatase: 129 U/L — ABNORMAL HIGH (ref 38–126)
Bilirubin, Direct: 30.5 mg/dL — ABNORMAL HIGH (ref 0.0–0.2)
Indirect Bilirubin: 17.3 mg/dL — ABNORMAL HIGH (ref 0.3–0.9)
Total Bilirubin: 47.8 mg/dL (ref 0.3–1.2)
Total Protein: 7.9 g/dL (ref 6.5–8.1)

## 2022-10-28 LAB — POCT I-STAT 7, (LYTES, BLD GAS, ICA,H+H)
Acid-base deficit: 5 mmol/L — ABNORMAL HIGH (ref 0.0–2.0)
Bicarbonate: 20.6 mmol/L (ref 20.0–28.0)
Calcium, Ion: 1.7 mmol/L (ref 1.15–1.40)
HCT: 23 % — ABNORMAL LOW (ref 39.0–52.0)
Hemoglobin: 7.8 g/dL — ABNORMAL LOW (ref 13.0–17.0)
O2 Saturation: 95 %
Patient temperature: 99.1
Potassium: 3.5 mmol/L (ref 3.5–5.1)
Sodium: 158 mmol/L — ABNORMAL HIGH (ref 135–145)
TCO2: 22 mmol/L (ref 22–32)
pCO2 arterial: 41.9 mmHg (ref 32–48)
pH, Arterial: 7.301 — ABNORMAL LOW (ref 7.35–7.45)
pO2, Arterial: 83 mmHg (ref 83–108)

## 2022-10-28 LAB — PROTIME-INR
INR: 2.6 — ABNORMAL HIGH (ref 0.8–1.2)
Prothrombin Time: 27.6 seconds — ABNORMAL HIGH (ref 11.4–15.2)

## 2022-10-28 LAB — CBC
HCT: 26.1 % — ABNORMAL LOW (ref 39.0–52.0)
Hemoglobin: 8.6 g/dL — ABNORMAL LOW (ref 13.0–17.0)
MCH: 34.3 pg — ABNORMAL HIGH (ref 26.0–34.0)
MCHC: 33 g/dL (ref 30.0–36.0)
MCV: 104 fL — ABNORMAL HIGH (ref 80.0–100.0)
Platelets: 39 10*3/uL — ABNORMAL LOW (ref 150–400)
RBC: 2.51 MIL/uL — ABNORMAL LOW (ref 4.22–5.81)
RDW: 23.9 % — ABNORMAL HIGH (ref 11.5–15.5)
WBC: 17.3 10*3/uL — ABNORMAL HIGH (ref 4.0–10.5)
nRBC: 1.7 % — ABNORMAL HIGH (ref 0.0–0.2)

## 2022-10-28 LAB — TROPONIN I (HIGH SENSITIVITY): Troponin I (High Sensitivity): 44 ng/L — ABNORMAL HIGH (ref <18)

## 2022-10-28 LAB — VITAMIN D 25 HYDROXY (VIT D DEFICIENCY, FRACTURES): Vit D, 25-Hydroxy: 27.56 ng/mL — ABNORMAL LOW (ref 30–100)

## 2022-10-28 LAB — MAGNESIUM: Magnesium: 2.9 mg/dL — ABNORMAL HIGH (ref 1.7–2.4)

## 2022-10-28 LAB — SODIUM, URINE, RANDOM: Sodium, Ur: 70 mmol/L

## 2022-10-28 LAB — PHOSPHORUS: Phosphorus: 5.3 mg/dL — ABNORMAL HIGH (ref 2.5–4.6)

## 2022-10-28 MED ORDER — VITAL AF 1.2 CAL PO LIQD
1000.0000 mL | ORAL | Status: DC
  Administered 2022-10-28 – 2022-10-31 (×5): 1000 mL

## 2022-10-28 MED ORDER — LACTULOSE 10 GM/15ML PO SOLN
30.0000 g | Freq: Three times a day (TID) | ORAL | Status: DC
  Administered 2022-10-28 – 2022-10-29 (×2): 30 g
  Filled 2022-10-28 (×2): qty 45

## 2022-10-28 MED ORDER — SODIUM CHLORIDE 0.9 % IV SOLN: 3.0000 g | Freq: Four times a day (QID) | INTRAVENOUS | Status: DC

## 2022-10-28 MED ORDER — SODIUM CHLORIDE 0.9 % IV SOLN
3.0000 g | Freq: Four times a day (QID) | INTRAVENOUS | Status: DC
  Administered 2022-10-28 – 2022-10-29 (×4): 3 g via INTRAVENOUS
  Filled 2022-10-28 (×5): qty 8

## 2022-10-28 MED ORDER — DEXTROSE 5 % IV SOLN: INTRAVENOUS | Status: AC

## 2022-10-28 MED ORDER — ZINC SULFATE 220 (50 ZN) MG PO CAPS
220.0000 mg | ORAL_CAPSULE | Freq: Every day | ORAL | Status: DC
  Administered 2022-10-28 – 2022-11-01 (×5): 220 mg
  Filled 2022-10-28 (×5): qty 1

## 2022-10-28 NOTE — Progress Notes (Addendum)
The bedside monitor started ringing out ST elevation. EKG said acute STEMI. Patient unable to answer questions. Elink MD, rapid response, and triad MD notified.

## 2022-10-29 ENCOUNTER — Inpatient Hospital Stay (HOSPITAL_COMMUNITY): Payer: Self-pay

## 2022-10-29 DIAGNOSIS — R9431 Abnormal electrocardiogram [ECG] [EKG]: Secondary | ICD-10-CM

## 2022-10-29 LAB — MISC LABCORP TEST (SEND OUT): Labcorp test code: 791584

## 2022-10-29 LAB — CBC
HCT: 23.1 % — ABNORMAL LOW (ref 39.0–52.0)
Hemoglobin: 7.5 g/dL — ABNORMAL LOW (ref 13.0–17.0)
MCH: 33.9 pg (ref 26.0–34.0)
MCHC: 32.5 g/dL (ref 30.0–36.0)
MCV: 104.5 fL — ABNORMAL HIGH (ref 80.0–100.0)
Platelets: 40 10*3/uL — ABNORMAL LOW (ref 150–400)
RBC: 2.21 MIL/uL — ABNORMAL LOW (ref 4.22–5.81)
RDW: 23.9 % — ABNORMAL HIGH (ref 11.5–15.5)
WBC: 18.6 10*3/uL — ABNORMAL HIGH (ref 4.0–10.5)
nRBC: 2.1 % — ABNORMAL HIGH (ref 0.0–0.2)

## 2022-10-29 LAB — COMPREHENSIVE METABOLIC PANEL
ALT: 56 U/L — ABNORMAL HIGH (ref 0–44)
AST: 120 U/L — ABNORMAL HIGH (ref 15–41)
Albumin: 3 g/dL — ABNORMAL LOW (ref 3.5–5.0)
Alkaline Phosphatase: 107 U/L (ref 38–126)
Anion gap: 15 (ref 5–15)
BUN: 67 mg/dL — ABNORMAL HIGH (ref 6–20)
CO2: 13 mmol/L — ABNORMAL LOW (ref 22–32)
Calcium: 11.1 mg/dL — ABNORMAL HIGH (ref 8.9–10.3)
Chloride: 114 mmol/L — ABNORMAL HIGH (ref 98–111)
Creatinine, Ser: 1.54 mg/dL — ABNORMAL HIGH (ref 0.61–1.24)
GFR, Estimated: >60 mL/min (ref >60)
Glucose, Bld: 173 mg/dL — ABNORMAL HIGH (ref 70–99)
Potassium: 3.2 mmol/L — ABNORMAL LOW (ref 3.5–5.1)
Sodium: 142 mmol/L (ref 135–145)
Total Bilirubin: 48.4 mg/dL (ref 0.3–1.2)
Total Protein: 7.1 g/dL (ref 6.5–8.1)

## 2022-10-29 LAB — TSH: TSH: 0.038 u[IU]/mL — ABNORMAL LOW (ref 0.350–4.500)

## 2022-10-29 LAB — HEMOGLOBIN A1C
Hgb A1c MFr Bld: 4.9 % (ref 4.8–5.6)
Mean Plasma Glucose: 94 mg/dL

## 2022-10-29 LAB — CORTISOL: Cortisol, Plasma: 4.2 ug/dL

## 2022-10-29 LAB — BASIC METABOLIC PANEL
Anion gap: 9 (ref 5–15)
BUN: 62 mg/dL — ABNORMAL HIGH (ref 6–20)
CO2: 19 mmol/L — ABNORMAL LOW (ref 22–32)
Calcium: 11.6 mg/dL — ABNORMAL HIGH (ref 8.9–10.3)
Chloride: 120 mmol/L — ABNORMAL HIGH (ref 98–111)
Creatinine, Ser: 1.47 mg/dL — ABNORMAL HIGH (ref 0.61–1.24)
GFR, Estimated: >60 mL/min (ref >60)
Glucose, Bld: 190 mg/dL — ABNORMAL HIGH (ref 70–99)
Potassium: 2.9 mmol/L — ABNORMAL LOW (ref 3.5–5.1)
Sodium: 148 mmol/L — ABNORMAL HIGH (ref 135–145)

## 2022-10-29 LAB — CALCITRIOL (1,25 DI-OH VIT D): Vit D, 1,25-Dihydroxy: <5 pg/mL — ABNORMAL LOW (ref 24.8–81.5)

## 2022-10-29 LAB — GLUCOSE, CAPILLARY
Glucose-Capillary: 159 mg/dL — ABNORMAL HIGH (ref 70–99)
Glucose-Capillary: 147 mg/dL — ABNORMAL HIGH (ref 70–99)
Glucose-Capillary: 147 mg/dL — ABNORMAL HIGH (ref 70–99)
Glucose-Capillary: 145 mg/dL — ABNORMAL HIGH (ref 70–99)
Glucose-Capillary: 160 mg/dL — ABNORMAL HIGH (ref 70–99)
Glucose-Capillary: 163 mg/dL — ABNORMAL HIGH (ref 70–99)
Glucose-Capillary: 180 mg/dL — ABNORMAL HIGH (ref 70–99)

## 2022-10-29 LAB — ECHOCARDIOGRAM COMPLETE
Area-P 1/2: 5.42 cm2
Height: 72 in
S' Lateral: 2.4 cm
Weight: 2465.62 oz

## 2022-10-29 LAB — VITAMIN B1: Vitamin B1 (Thiamine): 43.4 nmol/L — ABNORMAL LOW (ref 66.5–200.0)

## 2022-10-29 LAB — AMMONIA: Ammonia: 71 umol/L — ABNORMAL HIGH (ref 9–35)

## 2022-10-29 LAB — T4, FREE: Free T4: 0.69 ng/dL (ref 0.61–1.12)

## 2022-10-29 LAB — PHOSPHORUS: Phosphorus: 5 mg/dL — ABNORMAL HIGH (ref 2.5–4.6)

## 2022-10-29 LAB — TROPONIN I (HIGH SENSITIVITY): Troponin I (High Sensitivity): 50 ng/L — ABNORMAL HIGH (ref <18)

## 2022-10-29 LAB — MAGNESIUM: Magnesium: 2.8 mg/dL — ABNORMAL HIGH (ref 1.7–2.4)

## 2022-10-29 LAB — PARATHYROID HORMONE, INTACT (NO CA): PTH: 19 pg/mL (ref 15–65)

## 2022-10-29 MED ORDER — LACTULOSE 10 GM/15ML PO SOLN
30.0000 g | Freq: Four times a day (QID) | ORAL | Status: DC
  Administered 2022-10-29 – 2022-11-01 (×12): 30 g
  Filled 2022-10-29: qty 45
  Filled 2022-10-29: qty 60
  Filled 2022-10-29 (×3): qty 45
  Filled 2022-10-29: qty 60
  Filled 2022-10-29: qty 45
  Filled 2022-10-29: qty 60
  Filled 2022-10-29 (×2): qty 45
  Filled 2022-10-29 (×3): qty 60

## 2022-10-29 MED ORDER — POTASSIUM CHLORIDE 10 MEQ/100ML IV SOLN
10.0000 meq | INTRAVENOUS | Status: AC
  Administered 2022-10-29 (×4): 10 meq via INTRAVENOUS
  Filled 2022-10-29 (×4): qty 100

## 2022-10-29 MED ORDER — SODIUM CHLORIDE 0.9 % IV SOLN
3.0000 g | Freq: Four times a day (QID) | INTRAVENOUS | Status: DC
  Administered 2022-10-29 – 2022-11-01 (×12): 3 g via INTRAVENOUS
  Filled 2022-10-29 (×11): qty 8

## 2022-10-30 LAB — AMMONIA: Ammonia: 65 umol/L — ABNORMAL HIGH (ref 9–35)

## 2022-10-30 LAB — CBC
HCT: 25.2 % — ABNORMAL LOW (ref 39.0–52.0)
Hemoglobin: 8.3 g/dL — ABNORMAL LOW (ref 13.0–17.0)
MCH: 34 pg (ref 26.0–34.0)
MCHC: 32.9 g/dL (ref 30.0–36.0)
MCV: 103.3 fL — ABNORMAL HIGH (ref 80.0–100.0)
Platelets: 46 10*3/uL — ABNORMAL LOW (ref 150–400)
RBC: 2.44 MIL/uL — ABNORMAL LOW (ref 4.22–5.81)
RDW: 25.3 % — ABNORMAL HIGH (ref 11.5–15.5)
WBC: 18.2 10*3/uL — ABNORMAL HIGH (ref 4.0–10.5)
nRBC: 2.2 % — ABNORMAL HIGH (ref 0.0–0.2)

## 2022-10-30 LAB — GLUCOSE, CAPILLARY
Glucose-Capillary: 144 mg/dL — ABNORMAL HIGH (ref 70–99)
Glucose-Capillary: 142 mg/dL — ABNORMAL HIGH (ref 70–99)
Glucose-Capillary: 148 mg/dL — ABNORMAL HIGH (ref 70–99)
Glucose-Capillary: 162 mg/dL — ABNORMAL HIGH (ref 70–99)
Glucose-Capillary: 141 mg/dL — ABNORMAL HIGH (ref 70–99)
Glucose-Capillary: 162 mg/dL — ABNORMAL HIGH (ref 70–99)

## 2022-10-30 LAB — HEPATIC FUNCTION PANEL
ALT: 56 U/L — ABNORMAL HIGH (ref 0–44)
ALT: 66 U/L — ABNORMAL HIGH (ref 0–44)
AST: 117 U/L — ABNORMAL HIGH (ref 15–41)
AST: 130 U/L — ABNORMAL HIGH (ref 15–41)
Albumin: 3 g/dL — ABNORMAL LOW (ref 3.5–5.0)
Albumin: 3.2 g/dL — ABNORMAL LOW (ref 3.5–5.0)
Alkaline Phosphatase: 133 U/L — ABNORMAL HIGH (ref 38–126)
Alkaline Phosphatase: 122 U/L (ref 38–126)
Bilirubin, Direct: >30 mg/dL — ABNORMAL HIGH (ref 0.0–0.2)
Bilirubin, Direct: >30 mg/dL — ABNORMAL HIGH (ref 0.0–0.2)
Indirect Bilirubin: 16.1 mg/dL — ABNORMAL HIGH (ref 0.3–0.9)
Indirect Bilirubin: 21.8 mg/dL — ABNORMAL HIGH (ref 0.3–0.9)
Total Bilirubin: 49.3 mg/dL (ref 0.3–1.2)
Total Bilirubin: 53.4 mg/dL (ref 0.3–1.2)
Total Protein: 7.6 g/dL (ref 6.5–8.1)
Total Protein: 7.8 g/dL (ref 6.5–8.1)

## 2022-10-30 LAB — BASIC METABOLIC PANEL
Anion gap: 10 (ref 5–15)
BUN: 74 mg/dL — ABNORMAL HIGH (ref 6–20)
CO2: 17 mmol/L — ABNORMAL LOW (ref 22–32)
Calcium: 11.3 mg/dL — ABNORMAL HIGH (ref 8.9–10.3)
Chloride: 113 mmol/L — ABNORMAL HIGH (ref 98–111)
Creatinine, Ser: 1.59 mg/dL — ABNORMAL HIGH (ref 0.61–1.24)
GFR, Estimated: >60 mL/min (ref >60)
Glucose, Bld: 146 mg/dL — ABNORMAL HIGH (ref 70–99)
Potassium: 3.3 mmol/L — ABNORMAL LOW (ref 3.5–5.1)
Sodium: 140 mmol/L (ref 135–145)

## 2022-10-30 LAB — PROTIME-INR
INR: 2.3 — ABNORMAL HIGH (ref 0.8–1.2)
Prothrombin Time: 25.4 seconds — ABNORMAL HIGH (ref 11.4–15.2)

## 2022-10-30 LAB — MAGNESIUM: Magnesium: 3.2 mg/dL — ABNORMAL HIGH (ref 1.7–2.4)

## 2022-10-30 LAB — PHOSPHORUS: Phosphorus: 6.1 mg/dL — ABNORMAL HIGH (ref 2.5–4.6)

## 2022-10-30 MED ORDER — IPRATROPIUM-ALBUTEROL 0.5-2.5 (3) MG/3ML IN SOLN: 3.0000 mL | RESPIRATORY_TRACT | Status: DC | PRN

## 2022-10-30 MED ORDER — IPRATROPIUM-ALBUTEROL 0.5-2.5 (3) MG/3ML IN SOLN
3.0000 mL | Freq: Three times a day (TID) | RESPIRATORY_TRACT | Status: DC
  Administered 2022-10-30 – 2022-11-01 (×6): 3 mL via RESPIRATORY_TRACT
  Filled 2022-10-30 (×6): qty 3

## 2022-10-30 MED ORDER — POTASSIUM CHLORIDE 10 MEQ/100ML IV SOLN
10.0000 meq | INTRAVENOUS | Status: AC
  Administered 2022-10-30 (×3): 10 meq via INTRAVENOUS
  Filled 2022-10-30 (×3): qty 100

## 2022-10-31 ENCOUNTER — Inpatient Hospital Stay (HOSPITAL_COMMUNITY): Payer: Self-pay

## 2022-10-31 DIAGNOSIS — Z66 Do not resuscitate: Secondary | ICD-10-CM

## 2022-10-31 LAB — BASIC METABOLIC PANEL
Anion gap: 12 (ref 5–15)
BUN: 105 mg/dL — ABNORMAL HIGH (ref 6–20)
CO2: 15 mmol/L — ABNORMAL LOW (ref 22–32)
Calcium: 10.4 mg/dL — ABNORMAL HIGH (ref 8.9–10.3)
Chloride: 109 mmol/L (ref 98–111)
Creatinine, Ser: 2.02 mg/dL — ABNORMAL HIGH (ref 0.61–1.24)
GFR, Estimated: 45 mL/min — ABNORMAL LOW (ref >60)
Glucose, Bld: 174 mg/dL — ABNORMAL HIGH (ref 70–99)
Potassium: 3.2 mmol/L — ABNORMAL LOW (ref 3.5–5.1)
Sodium: 136 mmol/L (ref 135–145)

## 2022-10-31 LAB — HEPATIC FUNCTION PANEL
ALT: 63 U/L — ABNORMAL HIGH (ref 0–44)
AST: 123 U/L — ABNORMAL HIGH (ref 15–41)
Albumin: 3.2 g/dL — ABNORMAL LOW (ref 3.5–5.0)
Alkaline Phosphatase: 111 U/L (ref 38–126)
Bilirubin, Direct: >30 mg/dL — ABNORMAL HIGH (ref 0.0–0.2)
Total Bilirubin: 44.4 mg/dL (ref 0.3–1.2)
Total Protein: 7 g/dL (ref 6.5–8.1)

## 2022-10-31 LAB — GLUCOSE, CAPILLARY
Glucose-Capillary: 149 mg/dL — ABNORMAL HIGH (ref 70–99)
Glucose-Capillary: 136 mg/dL — ABNORMAL HIGH (ref 70–99)
Glucose-Capillary: 147 mg/dL — ABNORMAL HIGH (ref 70–99)
Glucose-Capillary: 171 mg/dL — ABNORMAL HIGH (ref 70–99)
Glucose-Capillary: 139 mg/dL — ABNORMAL HIGH (ref 70–99)
Glucose-Capillary: 159 mg/dL — ABNORMAL HIGH (ref 70–99)

## 2022-10-31 LAB — CULTURE, BLOOD (ROUTINE X 2)
Culture: NO GROWTH
Culture: NO GROWTH
Special Requests: ADEQUATE
Special Requests: ADEQUATE

## 2022-10-31 LAB — CBC
HCT: 25.3 % — ABNORMAL LOW (ref 39.0–52.0)
Hemoglobin: 8.3 g/dL — ABNORMAL LOW (ref 13.0–17.0)
MCH: 34.4 pg — ABNORMAL HIGH (ref 26.0–34.0)
MCHC: 32.8 g/dL (ref 30.0–36.0)
MCV: 105 fL — ABNORMAL HIGH (ref 80.0–100.0)
Platelets: 46 10*3/uL — ABNORMAL LOW (ref 150–400)
RBC: 2.41 MIL/uL — ABNORMAL LOW (ref 4.22–5.81)
RDW: 26.4 % — ABNORMAL HIGH (ref 11.5–15.5)
WBC: 17.6 10*3/uL — ABNORMAL HIGH (ref 4.0–10.5)
nRBC: 2 % — ABNORMAL HIGH (ref 0.0–0.2)

## 2022-10-31 MED ORDER — POTASSIUM CHLORIDE 10 MEQ/100ML IV SOLN
10.0000 meq | INTRAVENOUS | Status: AC
  Administered 2022-10-31 (×4): 10 meq via INTRAVENOUS
  Filled 2022-10-31 (×4): qty 100

## 2022-10-31 MED ORDER — ALBUMIN HUMAN 25 % IV SOLN
50.0000 g | Freq: Two times a day (BID) | INTRAVENOUS | Status: DC
  Administered 2022-10-31 – 2022-11-01 (×2): 50 g via INTRAVENOUS
  Filled 2022-10-31 (×2): qty 200

## 2022-10-31 MED ORDER — FREE WATER
300.0000 mL | Freq: Four times a day (QID) | Status: DC
  Administered 2022-10-31 – 2022-11-01 (×4): 300 mL

## 2022-10-31 MED ORDER — SODIUM BICARBONATE 650 MG PO TABS
650.0000 mg | ORAL_TABLET | Freq: Two times a day (BID) | ORAL | Status: DC
  Administered 2022-10-31 (×2): 650 mg
  Filled 2022-10-31 (×3): qty 1

## 2022-10-31 MED ORDER — LACTATED RINGERS IV SOLN: INTRAVENOUS | Status: DC

## 2022-11-01 LAB — DRUG SCREEN 10 W/CONF, SERUM

## 2022-11-01 LAB — CERULOPLASMIN: Ceruloplasmin: 9.9 mg/dL — ABNORMAL LOW (ref 16.0–31.0)

## 2022-11-01 LAB — CULTURE, RESPIRATORY W GRAM STAIN: Culture: NORMAL

## 2022-11-01 LAB — HCV RNA QUANT: HCV Quantitative: NOT DETECTED IU/mL (ref >50)

## 2022-11-01 LAB — AMMONIA: Ammonia: 87 umol/L — ABNORMAL HIGH (ref 9–35)

## 2022-11-01 LAB — CBC
HCT: 21.3 % — ABNORMAL LOW (ref 39.0–52.0)
Hemoglobin: 7 g/dL — ABNORMAL LOW (ref 13.0–17.0)
MCH: 34.5 pg — ABNORMAL HIGH (ref 26.0–34.0)
MCHC: 32.9 g/dL (ref 30.0–36.0)
MCV: 104.9 fL — ABNORMAL HIGH (ref 80.0–100.0)
Platelets: 43 10*3/uL — ABNORMAL LOW (ref 150–400)
RBC: 2.03 MIL/uL — ABNORMAL LOW (ref 4.22–5.81)
RDW: 27.6 % — ABNORMAL HIGH (ref 11.5–15.5)
WBC: 15 10*3/uL — ABNORMAL HIGH (ref 4.0–10.5)
nRBC: 2.7 % — ABNORMAL HIGH (ref 0.0–0.2)

## 2022-11-01 LAB — GLUCOSE, CAPILLARY
Glucose-Capillary: 136 mg/dL — ABNORMAL HIGH (ref 70–99)
Glucose-Capillary: 154 mg/dL — ABNORMAL HIGH (ref 70–99)

## 2022-11-01 LAB — PROTIME-INR
INR: 3.1 — ABNORMAL HIGH (ref 0.8–1.2)
INR: 3.9 — ABNORMAL HIGH (ref 0.8–1.2)
Prothrombin Time: 31.5 seconds — ABNORMAL HIGH (ref 11.4–15.2)
Prothrombin Time: 37.9 seconds — ABNORMAL HIGH (ref 11.4–15.2)

## 2022-11-01 LAB — BASIC METABOLIC PANEL
Anion gap: 13 (ref 5–15)
BUN: 141 mg/dL — ABNORMAL HIGH (ref 6–20)
CO2: 14 mmol/L — ABNORMAL LOW (ref 22–32)
Calcium: 10.7 mg/dL — ABNORMAL HIGH (ref 8.9–10.3)
Chloride: 110 mmol/L (ref 98–111)
Creatinine, Ser: 3 mg/dL — ABNORMAL HIGH (ref 0.61–1.24)
GFR, Estimated: 28 mL/min — ABNORMAL LOW (ref >60)
Glucose, Bld: 130 mg/dL — ABNORMAL HIGH (ref 70–99)
Potassium: 3.1 mmol/L — ABNORMAL LOW (ref 3.5–5.1)
Sodium: 137 mmol/L (ref 135–145)

## 2022-11-01 LAB — HEPATIC FUNCTION PANEL
ALT: 64 U/L — ABNORMAL HIGH (ref 0–44)
AST: 120 U/L — ABNORMAL HIGH (ref 15–41)
Albumin: 4.3 g/dL (ref 3.5–5.0)
Alkaline Phosphatase: 84 U/L (ref 38–126)
Bilirubin, Direct: >30 mg/dL — ABNORMAL HIGH (ref 0.0–0.2)
Total Bilirubin: >50 mg/dL (ref 0.3–1.2)
Total Protein: 7.8 g/dL (ref 6.5–8.1)

## 2022-11-01 LAB — IGM: IgM (Immunoglobulin M), Srm: 358 mg/dL — ABNORMAL HIGH (ref 20–172)

## 2022-11-01 LAB — HEPATITIS B DNA, ULTRAQUANTITATIVE, PCR
HBV DNA SERPL PCR-ACNC: NOT DETECTED IU/mL
HBV DNA SERPL PCR-LOG IU: UNDETERMINED log10 IU/mL

## 2022-11-01 LAB — IGG: IgG (Immunoglobin G), Serum: 2109 mg/dL — ABNORMAL HIGH (ref 603–1613)

## 2022-11-01 MED ORDER — ONDANSETRON 4 MG PO TBDP: 4.0000 mg | ORAL_TABLET | Freq: Four times a day (QID) | ORAL | Status: DC | PRN

## 2022-11-01 MED ORDER — SODIUM BICARBONATE 650 MG PO TABS
1300.0000 mg | ORAL_TABLET | Freq: Two times a day (BID) | ORAL | Status: DC
  Administered 2022-11-01: 1300 mg

## 2022-11-01 MED ORDER — MIDAZOLAM HCL 2 MG/2ML IJ SOLN: 2.0000 mg | INTRAMUSCULAR | Status: DC | PRN

## 2022-11-01 MED ORDER — BIOTENE DRY MOUTH MT LIQD: 15.0000 mL | OROMUCOSAL | Status: DC | PRN

## 2022-11-01 MED ORDER — CHLORHEXIDINE GLUCONATE CLOTH 2 % EX PADS
6.0000 | MEDICATED_PAD | CUTANEOUS | Status: DC
  Administered 2022-11-01: 6 via TOPICAL

## 2022-11-01 MED ORDER — IPRATROPIUM-ALBUTEROL 0.5-2.5 (3) MG/3ML IN SOLN: 3.0000 mL | Freq: Four times a day (QID) | RESPIRATORY_TRACT | Status: DC | PRN

## 2022-11-01 MED ORDER — POTASSIUM CHLORIDE 20 MEQ PO PACK
40.0000 meq | PACK | Freq: Once | ORAL | Status: AC
  Administered 2022-11-01: 40 meq
  Filled 2022-11-01: qty 2

## 2022-11-01 MED ORDER — LORAZEPAM 2 MG/ML IJ SOLN: 0.5000 mg | INTRAMUSCULAR | Status: DC | PRN

## 2022-11-01 MED ORDER — ONDANSETRON HCL 4 MG/2ML IJ SOLN: 4.0000 mg | Freq: Four times a day (QID) | INTRAMUSCULAR | Status: DC | PRN

## 2022-11-01 MED ORDER — GLYCOPYRROLATE 0.2 MG/ML IJ SOLN
0.4000 mg | INTRAMUSCULAR | Status: DC
  Administered 2022-11-01 (×2): 0.4 mg via INTRAVENOUS
  Filled 2022-11-01 (×2): qty 2

## 2022-11-01 MED ORDER — HYDROMORPHONE BOLUS VIA INFUSION
0.5000 mg | INTRAVENOUS | Status: DC | PRN
  Administered 2022-11-01 (×2): 0.5 mg via INTRAVENOUS

## 2022-11-01 MED ORDER — HYDROMORPHONE HCL-NACL 50-0.9 MG/50ML-% IV SOLN
0.5000 mg/h | INTRAVENOUS | Status: DC
  Administered 2022-11-01: 1 mg/h via INTRAVENOUS
  Filled 2022-11-01 (×3): qty 50

## 2022-11-01 MED ORDER — ACETAMINOPHEN 325 MG PO TABS: 650.0000 mg | ORAL_TABLET | Freq: Four times a day (QID) | ORAL | Status: DC | PRN

## 2022-11-01 MED ORDER — POLYVINYL ALCOHOL 1.4 % OP SOLN: 1.0000 [drp] | Freq: Four times a day (QID) | OPHTHALMIC | Status: DC | PRN

## 2022-11-01 MED ORDER — ACETAMINOPHEN 650 MG RE SUPP: 650.0000 mg | Freq: Four times a day (QID) | RECTAL | Status: DC | PRN

## 2022-11-03 LAB — COPPER, URINE - RANDOM OR 24 HOUR
Copper / Creatinine Ratio: 71 ug/g creat — ABNORMAL HIGH (ref 0–49)
Copper, 24H Ur: 26 ug/24 hr (ref 3–35)
Copper, Ur: 44 ug/L
Creatinine(Crt),U: 0.62 g/L (ref 0.30–3.00)
Total Volume: 600

## 2022-11-12 LAB — MISC LABCORP TEST (SEND OUT): Labcorp test code: 520197

## 2022-11-13 LAB — BENZODIAZEPINES,MS,WB/SP RFX
7-Aminoclonazepam: NEGATIVE ng/mL
Alprazolam: NEGATIVE ng/mL
Benzodiazepines Confirm: POSITIVE
Chlordiazepoxide: NEGATIVE
Clonazepam: NEGATIVE ng/mL
Desalkylflurazepam: NEGATIVE ng/mL
Desmethylchlordiazepoxide: NEGATIVE
Desmethyldiazepam: NEGATIVE ng/mL
Diazepam: NEGATIVE ng/mL
Flurazepam: NEGATIVE ng/mL
Lorazepam: NEGATIVE ng/mL
Midazolam: 10.6 ng/mL
Oxazepam: NEGATIVE ng/mL
Temazepam: NEGATIVE ng/mL
Triazolam: NEGATIVE ng/mL

## 2022-11-24 DEATH — deceased

## 2022-11-26 LAB — MISC LABCORP TEST (SEND OUT): Labcorp test code: 700888

## 2023-09-02 IMAGING — MR MR HEAD W/O CM
6 of 10 series · 33 of 48 positions shown · non-contrast
Comparison: None.

CLINICAL DATA: Psychosis

EXAM:
MRI HEAD WITHOUT CONTRAST
TECHNIQUE: Multiplanar, multiecho pulse sequences of the brain and surrounding
structures were obtained without intravenous contrast.

[Series 5: DWI · axial · 3.0mm · 0.88mm/px · z∈[-105,+46]mm · 10 of 106 slices shown (1 of 4)]
[im 1/106]
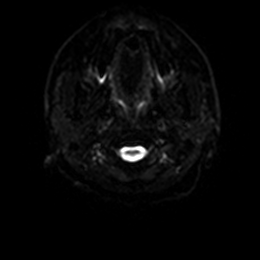
[im 12/106]
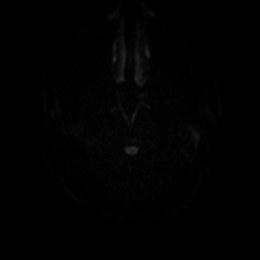
[im 24/106]
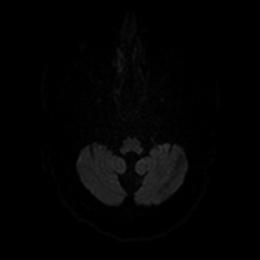
[im 36/106]
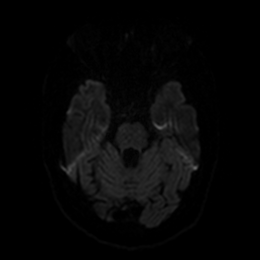
[im 47/106]
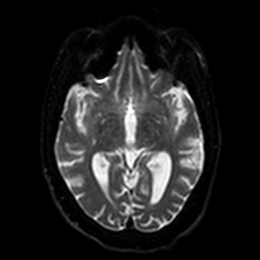
[im 59/106]
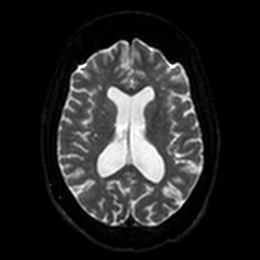
[im 71/106]
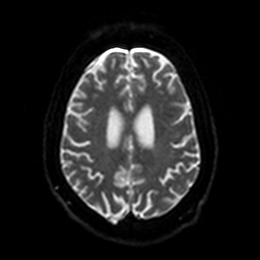
[im 82/106]
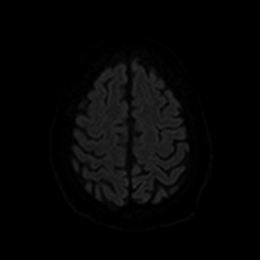
[im 94/106]
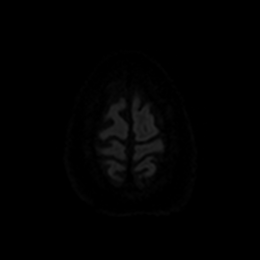
[im 106/106]
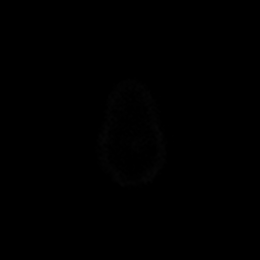

[Series 6: DWI · axial · 3.0mm · 0.88mm/px · z∈[-105,+46]mm · 5 of 53 slices shown (2 of 4)]
[im 1/53]
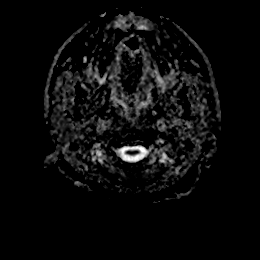
[im 14/53]
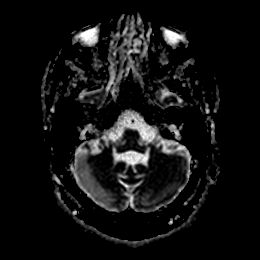
[im 27/53]
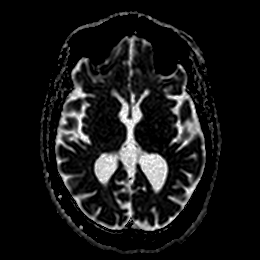
[im 40/53]
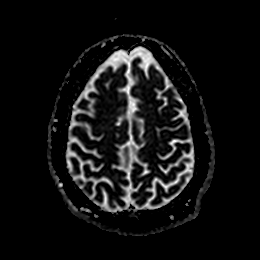
[im 53/53]
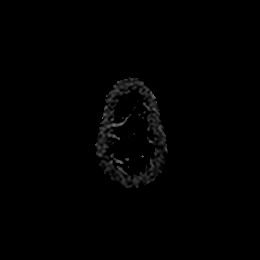

[Series 7: DWI · coronal · 4.0mm · 0.88mm/px · 8 of 72 slices shown (3 of 4)]
[im 1/72]
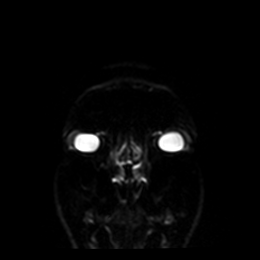
[im 11/72]
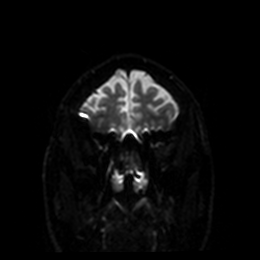
[im 21/72]
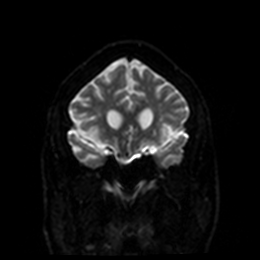
[im 31/72]
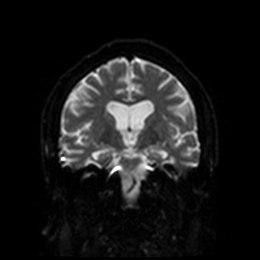
[im 41/72]
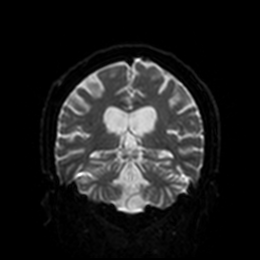
[im 51/72]
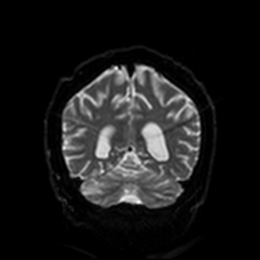
[im 61/72]
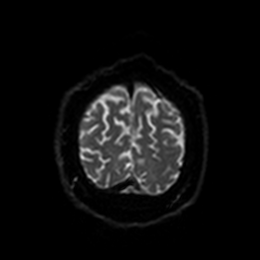
[im 72/72]
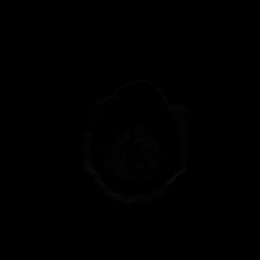

[Series 8: DWI · coronal · 4.0mm · 0.88mm/px · 4 of 36 slices shown (4 of 4)]
[im 1/36]
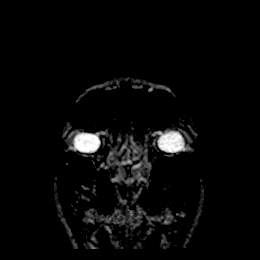
[im 12/36]
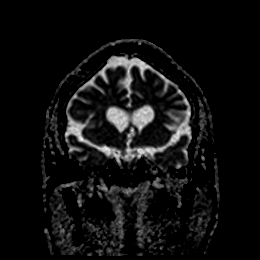
[im 24/36]
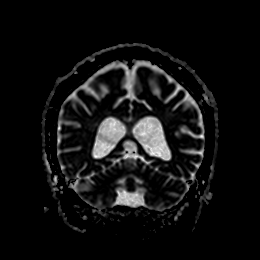
[im 36/36]
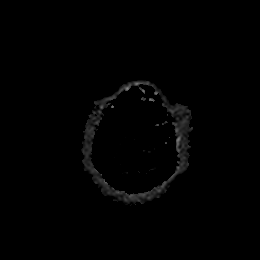

[Series 9: T1 · sagittal · 5.0mm · 0.75mm/px · 3 of 25 slices shown]
[im 1/25]
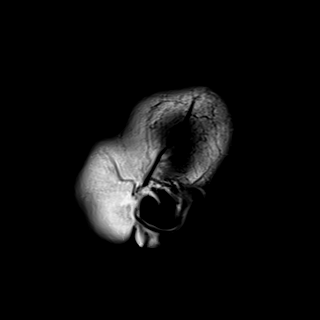
[im 13/25]
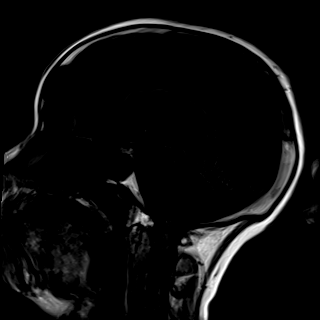
[im 25/25]
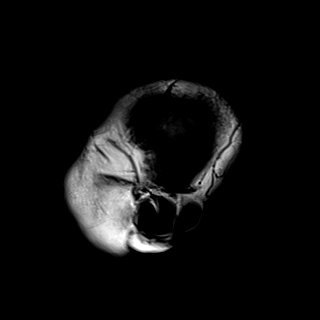

[Series 10: T2 · axial · 5.0mm · 0.72mm/px · z∈[-117,+45]mm · 3 of 29 slices shown]
[im 1/29]
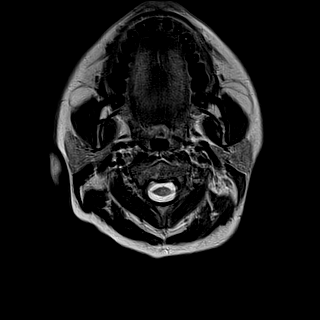
[im 15/29]
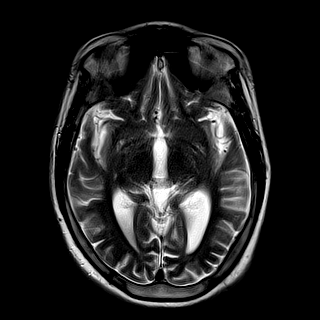
[im 29/29]
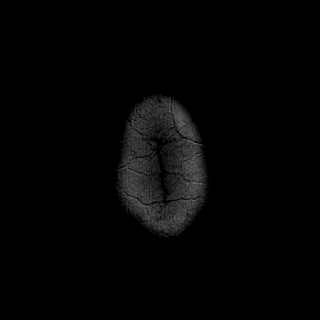

[33 of 48 positions shown; findings below may reference images not displayed]

FINDINGS: Only diffusion-weighted imaging, axial T2-weighted imaging and
sagittal T1-weighted imaging were performed.

There is no acute infarct. No midline shift or other mass effect.
Midline structures are normal.
IMPRESSION: Truncated examination. No acute infarct.
